# Patient Record
Sex: Female | Born: 1975 | Race: Black or African American | Hispanic: No | Marital: Married | Smoking: Never smoker
Health system: Southern US, Community
[De-identification: ages and names within clinical notes are randomized; demographics above are authoritative.]

## PROBLEM LIST (undated history)

## (undated) DIAGNOSIS — I10 Essential (primary) hypertension: Secondary | ICD-10-CM

## (undated) DIAGNOSIS — E119 Type 2 diabetes mellitus without complications: Secondary | ICD-10-CM

## (undated) HISTORY — PX: GASTRIC BYPASS: SHX52

---

## 1997-11-05 ENCOUNTER — Other Ambulatory Visit: Admission: RE | Admit: 1997-11-05 | Discharge: 1997-11-05 | Payer: Self-pay | Admitting: Obstetrics & Gynecology

## 2000-04-22 ENCOUNTER — Encounter: Admission: RE | Admit: 2000-04-22 | Discharge: 2000-04-22 | Payer: Self-pay | Admitting: Family Medicine

## 2000-04-22 ENCOUNTER — Encounter: Payer: Self-pay | Admitting: Family Medicine

## 2001-07-03 ENCOUNTER — Emergency Department (HOSPITAL_COMMUNITY): Admission: EM | Admit: 2001-07-03 | Discharge: 2001-07-03 | Payer: Self-pay | Admitting: Emergency Medicine

## 2001-07-09 ENCOUNTER — Encounter: Admission: RE | Admit: 2001-07-09 | Discharge: 2001-10-07 | Payer: Self-pay | Admitting: Family Medicine

## 2001-12-08 ENCOUNTER — Emergency Department (HOSPITAL_COMMUNITY): Admission: EM | Admit: 2001-12-08 | Discharge: 2001-12-08 | Payer: Self-pay | Admitting: Emergency Medicine

## 2002-07-02 ENCOUNTER — Ambulatory Visit (HOSPITAL_COMMUNITY): Admission: RE | Admit: 2002-07-02 | Discharge: 2002-07-02 | Payer: Self-pay | Admitting: Family Medicine

## 2002-07-02 ENCOUNTER — Encounter: Payer: Self-pay | Admitting: Family Medicine

## 2003-01-01 ENCOUNTER — Encounter: Admission: RE | Admit: 2003-01-01 | Discharge: 2003-01-01 | Payer: Self-pay | Admitting: Family Medicine

## 2004-08-03 ENCOUNTER — Emergency Department (HOSPITAL_COMMUNITY): Admission: EM | Admit: 2004-08-03 | Discharge: 2004-08-03 | Payer: Self-pay | Admitting: Emergency Medicine

## 2005-04-19 ENCOUNTER — Encounter: Admission: RE | Admit: 2005-04-19 | Discharge: 2005-04-19 | Payer: Self-pay | Admitting: Internal Medicine

## 2007-10-27 ENCOUNTER — Emergency Department (HOSPITAL_COMMUNITY): Admission: EM | Admit: 2007-10-27 | Discharge: 2007-10-28 | Payer: Self-pay | Admitting: Emergency Medicine

## 2008-10-12 ENCOUNTER — Emergency Department (HOSPITAL_COMMUNITY): Admission: EM | Admit: 2008-10-12 | Discharge: 2008-10-12 | Payer: Self-pay | Admitting: Emergency Medicine

## 2010-04-29 ENCOUNTER — Emergency Department (HOSPITAL_COMMUNITY)
Admission: EM | Admit: 2010-04-29 | Discharge: 2010-04-29 | Disposition: A | Discharge status: Home / Self Care | Payer: BC Managed Care – PPO | Attending: Emergency Medicine | Admitting: Emergency Medicine

## 2010-04-29 ENCOUNTER — Emergency Department (HOSPITAL_COMMUNITY): Payer: BC Managed Care – PPO

## 2010-04-29 DIAGNOSIS — E119 Type 2 diabetes mellitus without complications: Secondary | ICD-10-CM | POA: Insufficient documentation

## 2010-04-29 DIAGNOSIS — F411 Generalized anxiety disorder: Secondary | ICD-10-CM | POA: Insufficient documentation

## 2010-04-29 DIAGNOSIS — R6889 Other general symptoms and signs: Secondary | ICD-10-CM | POA: Insufficient documentation

## 2010-05-05 LAB — CBC
MCHC: 33.6 g/dL (ref 30.0–36.0)
MCV: 89.1 fL (ref 78.0–100.0)
RBC: 4.43 MIL/uL (ref 3.87–5.11)
RDW: 13.8 % (ref 11.5–15.5)

## 2010-05-05 LAB — URINALYSIS, ROUTINE W REFLEX MICROSCOPIC
Ketones, ur: NEGATIVE mg/dL
Nitrite: NEGATIVE
Protein, ur: NEGATIVE mg/dL
pH: 6.5 (ref 5.0–8.0)

## 2010-05-05 LAB — DIFFERENTIAL
Basophils Absolute: 0.1 10*3/uL (ref 0.0–0.1)
Basophils Relative: 1 % (ref 0–1)
Eosinophils Absolute: 0.1 10*3/uL (ref 0.0–0.7)
Monocytes Relative: 6 % (ref 3–12)
Neutro Abs: 5.6 10*3/uL (ref 1.7–7.7)
Neutrophils Relative %: 62 % (ref 43–77)

## 2010-05-05 LAB — POCT I-STAT, CHEM 8
BUN: 7 mg/dL (ref 6–23)
Calcium, Ion: 1.18 mmol/L (ref 1.12–1.32)
Chloride: 104 mEq/L (ref 96–112)
Glucose, Bld: 136 mg/dL — ABNORMAL HIGH (ref 70–99)
TCO2: 27 mmol/L (ref 0–100)

## 2010-05-05 LAB — HEPATIC FUNCTION PANEL
AST: 25 U/L (ref 0–37)
Albumin: 3.3 g/dL — ABNORMAL LOW (ref 3.5–5.2)
Bilirubin, Direct: 0.1 mg/dL (ref 0.0–0.3)
Total Bilirubin: 0.5 mg/dL (ref 0.3–1.2)

## 2010-05-05 LAB — GLUCOSE, CAPILLARY: Glucose-Capillary: 131 mg/dL — ABNORMAL HIGH (ref 70–99)

## 2010-05-05 LAB — LIPASE, BLOOD: Lipase: 16 U/L (ref 11–59)

## 2010-05-14 ENCOUNTER — Emergency Department (HOSPITAL_COMMUNITY)
Admission: EM | Admit: 2010-05-14 | Discharge: 2010-05-14 | Disposition: A | Discharge status: Home / Self Care | Payer: BC Managed Care – PPO | Attending: Emergency Medicine | Admitting: Emergency Medicine

## 2010-05-14 DIAGNOSIS — IMO0002 Reserved for concepts with insufficient information to code with codable children: Secondary | ICD-10-CM | POA: Insufficient documentation

## 2010-05-14 DIAGNOSIS — R000 Tachycardia, unspecified: Secondary | ICD-10-CM | POA: Insufficient documentation

## 2010-05-14 DIAGNOSIS — E119 Type 2 diabetes mellitus without complications: Secondary | ICD-10-CM | POA: Insufficient documentation

## 2010-05-14 DIAGNOSIS — I10 Essential (primary) hypertension: Secondary | ICD-10-CM | POA: Insufficient documentation

## 2010-05-14 DIAGNOSIS — T18108A Unspecified foreign body in esophagus causing other injury, initial encounter: Secondary | ICD-10-CM | POA: Insufficient documentation

## 2010-05-15 ENCOUNTER — Telehealth: Payer: Self-pay | Admitting: Internal Medicine

## 2010-05-15 NOTE — Telephone Encounter (Signed)
I spoke with the patient and she is scheduled for EGD with possible dil per Dr Leone Payor at [facility] tomorrow at 10:30.  She is given verbal instructions to hold her diabetes meds and be NPO after midnight.  Patient verbalized understanding.

## 2010-05-16 ENCOUNTER — Telehealth: Payer: Self-pay | Admitting: *Deleted

## 2010-05-16 ENCOUNTER — Ambulatory Visit (HOSPITAL_COMMUNITY)
Admission: RE | Admit: 2010-05-16 | Discharge: 2010-05-16 | Disposition: A | Discharge status: Home / Self Care | Payer: BC Managed Care – PPO | Source: Ambulatory Visit | Attending: Internal Medicine | Admitting: Internal Medicine

## 2010-05-16 ENCOUNTER — Encounter: Payer: BC Managed Care – PPO | Admitting: Internal Medicine

## 2010-05-16 ENCOUNTER — Telehealth: Payer: Self-pay | Admitting: Internal Medicine

## 2010-05-16 DIAGNOSIS — Z01812 Encounter for preprocedural laboratory examination: Secondary | ICD-10-CM | POA: Insufficient documentation

## 2010-05-16 DIAGNOSIS — Z9884 Bariatric surgery status: Secondary | ICD-10-CM | POA: Insufficient documentation

## 2010-05-16 DIAGNOSIS — R933 Abnormal findings on diagnostic imaging of other parts of digestive tract: Secondary | ICD-10-CM

## 2010-05-16 DIAGNOSIS — K222 Esophageal obstruction: Secondary | ICD-10-CM | POA: Insufficient documentation

## 2010-05-16 DIAGNOSIS — R1310 Dysphagia, unspecified: Secondary | ICD-10-CM | POA: Insufficient documentation

## 2010-05-16 DIAGNOSIS — R1319 Other dysphagia: Secondary | ICD-10-CM

## 2010-05-16 NOTE — Telephone Encounter (Signed)
Message copied by Jesse Fall on Tue May 16, 2010  3:48 PM ------      Message from: Stan Head      Created: Tue May 16, 2010 11:23 AM       Needs upper GI seris arranged - call tomorrow as she had EGD today      Thanx

## 2010-05-16 NOTE — Telephone Encounter (Signed)
Scheduled Upper GI series at [facility] radiology on 05/18/10 at 9:00 AM/arrival at 8:45 AM(Jennifer) . NPO after midnight.

## 2010-05-16 NOTE — Telephone Encounter (Signed)
Spoke with patient and she states she ate cheese several hours ago and it is stuck in her throat. Feels it moving up and down. Paged Dr. Leone Payor Per Dr. Leone Payor, as long as she is drinking liquids and handling her secretions, she should go back to fluids only tonight. Call us tomorrow if not any better. Patient states she has had 2 bottles of water but still feels the cheese moving up and down and she cannot get any relief.

## 2010-05-17 ENCOUNTER — Telehealth: Payer: Self-pay | Admitting: *Deleted

## 2010-05-17 NOTE — Telephone Encounter (Signed)
Jennifer at radiology made a note for patient to swallow a Barium tablet with upper GI series.

## 2010-05-17 NOTE — Telephone Encounter (Signed)
Patient called and left a message for me to call her at [phone]. Spoke with patient and gave her the appointment and instructions. She wanted to know if she could change the appointment time. Gave her [facility] radiology number to call.

## 2010-05-17 NOTE — Telephone Encounter (Signed)
Left message for patient to call me back re: her appointment at [facility] for  Upper GI series- 05/18/10 8:45 AM arrival for 9:00 AM, NPO after Midnight.

## 2010-05-17 NOTE — Telephone Encounter (Signed)
Call radiology and ask them to have her swallow a barium tablet with her Upper GI also.

## 2010-05-18 ENCOUNTER — Ambulatory Visit (HOSPITAL_COMMUNITY)
Admit: 2010-05-18 | Discharge: 2010-05-18 | Disposition: A | Discharge status: Home / Self Care | Payer: BC Managed Care – PPO | Attending: Internal Medicine | Admitting: Internal Medicine

## 2010-05-18 ENCOUNTER — Other Ambulatory Visit: Payer: Self-pay | Admitting: Internal Medicine

## 2010-05-18 ENCOUNTER — Telehealth: Payer: Self-pay

## 2010-05-18 DIAGNOSIS — Z9884 Bariatric surgery status: Secondary | ICD-10-CM

## 2010-05-18 DIAGNOSIS — R1310 Dysphagia, unspecified: Secondary | ICD-10-CM | POA: Insufficient documentation

## 2010-05-18 NOTE — Telephone Encounter (Signed)
Message copied by Darcey Nora on Thu May 18, 2010  4:01 PM ------      Message from: Stan Head      Created: Thu May 18, 2010  3:57 PM       The upper GI did not reveal a cause. It looks normal.      Ask her if she still has the sensation of the tablet in her esophagus or if it has gone away, when did that happen.

## 2010-05-18 NOTE — Telephone Encounter (Signed)
Lets try prevacid Solutab for 2 months. If it does not resolve after 4-6 weeks call back

## 2010-05-18 NOTE — Progress Notes (Signed)
Quick Note:  The upper GI did not reveal a cause. It looks normal. Ask her if she still has the sensation of the tablet in her esophagus or if it has gone away, when did that happen. ______

## 2010-05-18 NOTE — Telephone Encounter (Signed)
She says that the sensation of something being in her throat is always there is always .  She ?s If this could be allergies? She also had discomfort in her chest and back she took Tums and the pain went away.  She is requesting something that can be crushed or a liquid if you are going to prescribe meds.

## 2010-05-18 NOTE — Progress Notes (Signed)
See phone note for details.  Pt advised of results

## 2010-05-19 ENCOUNTER — Telehealth: Payer: Self-pay | Admitting: *Deleted

## 2010-05-19 MED ORDER — LANSOPRAZOLE 30 MG PO TBDP: 30.0000 mg | ORAL_TABLET | Freq: Every day | ORAL | Status: DC

## 2010-05-19 NOTE — Telephone Encounter (Signed)
Prior Authorization.

## 2010-05-19 NOTE — Telephone Encounter (Signed)
Patient aware of the above, rx sent she will call back for any further problems

## 2010-05-22 ENCOUNTER — Other Ambulatory Visit: Payer: Self-pay | Admitting: Internal Medicine

## 2010-05-22 ENCOUNTER — Telehealth: Payer: Self-pay | Admitting: *Deleted

## 2010-05-22 NOTE — Telephone Encounter (Addendum)
Called in RX to [facility] [place]... Spoke with Johnny Bridge and she put in a 90 day supply for Nexium 40 mg oral suspension.  Appeal number is [id number]  Will send papers down to be scanned in     Samples up front

## 2010-05-23 NOTE — Telephone Encounter (Signed)
I called patient at home and work.  Left a message. Just need to inform patient that she has Nexium samples up front and new RX was sent to her pharmacy for Nexium powder

## 2010-07-06 ENCOUNTER — Other Ambulatory Visit: Payer: Self-pay | Admitting: Internal Medicine

## 2010-07-06 MED ORDER — ESOMEPRAZOLE MAGNESIUM 40 MG PO PACK: PACK | ORAL | Status: DC

## 2010-07-06 NOTE — Telephone Encounter (Signed)
Refilled Nexium 40 mg packet #30 with 11 refills sent to [facility] [place], [place]

## 2010-07-07 ENCOUNTER — Telehealth: Payer: Self-pay | Admitting: Internal Medicine

## 2010-07-07 MED ORDER — ESOMEPRAZOLE MAGNESIUM 40 MG PO CPDR: DELAYED_RELEASE_CAPSULE | ORAL | Status: DC

## 2010-07-07 NOTE — Telephone Encounter (Signed)
[facility] packets are not in stock. Talked with the patient about taking Nexium 40 mg tablets (open tablet and put in applesauce) she stated that she could try that. Called [facility] [place],  and ordered Nexium 40 mg 1 tablet (open and put in applesauce) every day #30 with 2 refills.

## 2010-07-07 NOTE — Telephone Encounter (Signed)
Called patient and left message for her to call back.

## 2010-07-31 ENCOUNTER — Telehealth: Payer: Self-pay | Admitting: Internal Medicine

## 2010-07-31 MED ORDER — LANSOPRAZOLE 30 MG PO CPDR: DELAYED_RELEASE_CAPSULE | ORAL | Status: DC

## 2010-07-31 NOTE — Telephone Encounter (Signed)
Patient informed that medication was sent to her pharmacy and is aware of the directions.

## 2010-07-31 NOTE — Telephone Encounter (Signed)
Left a return call message and left a message for patient to call me back.

## 2010-07-31 NOTE — Telephone Encounter (Signed)
See lansoprazole rx - to open and place into applesauce

## 2010-10-30 LAB — CBC
HCT: 39.6
Hemoglobin: 13.4
MCV: 90.3
Platelets: ADEQUATE
RDW: 14.1

## 2010-10-30 LAB — BASIC METABOLIC PANEL
BUN: 8
Chloride: 103
GFR calc Af Amer: >60
Glucose, Bld: 133 — ABNORMAL HIGH
Potassium: 4.2

## 2010-10-30 LAB — DIFFERENTIAL
Basophils Absolute: 0
Eosinophils Absolute: 0.1
Eosinophils Relative: 1
Lymphs Abs: 2.9
Monocytes Absolute: 1

## 2010-10-30 LAB — RAPID URINE DRUG SCREEN, HOSP PERFORMED
Amphetamines: NOT DETECTED
Benzodiazepines: NOT DETECTED
Cocaine: NOT DETECTED
Tetrahydrocannabinol: NOT DETECTED

## 2010-10-30 LAB — D-DIMER, QUANTITATIVE: D-Dimer, Quant: 4.42 — ABNORMAL HIGH

## 2010-11-01 ENCOUNTER — Other Ambulatory Visit: Payer: Self-pay | Admitting: Internal Medicine

## 2014-03-09 ENCOUNTER — Other Ambulatory Visit: Payer: Self-pay

## 2014-03-09 ENCOUNTER — Emergency Department (HOSPITAL_COMMUNITY)
Admission: EM | Admit: 2014-03-09 | Discharge: 2014-03-09 | Disposition: A | Discharge status: Home / Self Care | Payer: Medicaid Other | Attending: Emergency Medicine | Admitting: Emergency Medicine

## 2014-03-09 ENCOUNTER — Encounter (HOSPITAL_COMMUNITY): Payer: Self-pay | Admitting: *Deleted

## 2014-03-09 ENCOUNTER — Emergency Department (HOSPITAL_COMMUNITY): Payer: Medicaid Other

## 2014-03-09 DIAGNOSIS — R079 Chest pain, unspecified: Secondary | ICD-10-CM | POA: Diagnosis present

## 2014-03-09 DIAGNOSIS — I10 Essential (primary) hypertension: Secondary | ICD-10-CM | POA: Insufficient documentation

## 2014-03-09 DIAGNOSIS — E119 Type 2 diabetes mellitus without complications: Secondary | ICD-10-CM | POA: Diagnosis not present

## 2014-03-09 DIAGNOSIS — R0789 Other chest pain: Secondary | ICD-10-CM | POA: Diagnosis not present

## 2014-03-09 HISTORY — DX: Type 2 diabetes mellitus without complications: E11.9

## 2014-03-09 HISTORY — DX: Essential (primary) hypertension: I10

## 2014-03-09 LAB — CBC
HEMATOCRIT: 38.1 % (ref 36.0–46.0)
Hemoglobin: 13 g/dL (ref 12.0–15.0)
MCH: 29.3 pg (ref 26.0–34.0)
MCHC: 34.1 g/dL (ref 30.0–36.0)
MCV: 86 fL (ref 78.0–100.0)
Platelets: 291 10*3/uL (ref 150–400)
RBC: 4.43 MIL/uL (ref 3.87–5.11)
RDW: 13.8 % (ref 11.5–15.5)
WBC: 9.9 10*3/uL (ref 4.0–10.5)

## 2014-03-09 LAB — BASIC METABOLIC PANEL
Anion gap: 11 (ref 5–15)
BUN: 9 mg/dL (ref 6–23)
CHLORIDE: 101 mmol/L (ref 96–112)
CO2: 25 mmol/L (ref 19–32)
CREATININE: 0.61 mg/dL (ref 0.50–1.10)
Calcium: 9.5 mg/dL (ref 8.4–10.5)
GFR calc Af Amer: >90 mL/min (ref >90)
GFR calc non Af Amer: >90 mL/min (ref >90)
Glucose, Bld: 168 mg/dL — ABNORMAL HIGH (ref 70–99)
Potassium: 3.7 mmol/L (ref 3.5–5.1)
Sodium: 137 mmol/L (ref 135–145)

## 2014-03-09 LAB — I-STAT TROPONIN, ED: Troponin i, poc: 0 ng/mL (ref 0.00–0.08)

## 2014-03-09 NOTE — ED Notes (Signed)
Consulted with lab about I-stat troponin results, Phlebotomist to room to collect

## 2014-03-09 NOTE — Discharge Instructions (Signed)
Please follow up with your primary care physician in 1-2 days. If you do not have one please call the [facility] number listed above. Please read all discharge instructions and return precautions.  ° ° °Chest Pain (Nonspecific) °It is often hard to give a specific diagnosis for the cause of chest pain. There is always a chance that your pain could be related to something serious, such as a heart attack or a blood clot in the lungs. You need to follow up with your health care provider for further evaluation. °CAUSES  °· Heartburn. °· Pneumonia or bronchitis. °· Anxiety or stress. °· Inflammation around your heart (pericarditis) or lung (pleuritis or pleurisy). °· A blood clot in the lung. °· A collapsed lung (pneumothorax). It can develop suddenly on its own (spontaneous pneumothorax) or from trauma to the chest. °· Shingles infection (herpes zoster virus). °The chest wall is composed of bones, muscles, and cartilage. Any of these can be the source of the pain. °· The bones can be bruised by injury. °· The muscles or cartilage can be strained by coughing or overwork. °· The cartilage can be affected by inflammation and become sore (costochondritis). °DIAGNOSIS  °Lab tests or other studies may be needed to find the cause of your pain. Your health care provider may have you take a test called an ambulatory electrocardiogram (ECG). An ECG records your heartbeat patterns over a 24-hour period. You may also have other tests, such as: °· Transthoracic echocardiogram (TTE). During echocardiography, sound waves are used to evaluate how blood flows through your heart. °· Transesophageal echocardiogram (TEE). °· Cardiac monitoring. This allows your health care provider to monitor your heart rate and rhythm in real time. °· Holter monitor. This is a portable device that records your heartbeat and can help diagnose heart arrhythmias. It allows your health care provider to track your heart activity for  several days, if needed. °· Stress tests by exercise or by giving medicine that makes the heart beat faster. °TREATMENT  °· Treatment depends on what may be causing your chest pain. Treatment may include: °¨ Acid blockers for heartburn. °¨ Anti-inflammatory medicine. °¨ Pain medicine for inflammatory conditions. °¨ Antibiotics if an infection is present. °· You may be advised to change lifestyle habits. This includes stopping smoking and avoiding alcohol, caffeine, and chocolate. °· You may be advised to keep your head raised (elevated) when sleeping. This reduces the chance of acid going backward from your stomach into your esophagus. °Most of the time, nonspecific chest pain will improve within 2-3 days with rest and mild pain medicine.  °HOME CARE INSTRUCTIONS  °· If antibiotics were prescribed, take them as directed. Finish them even if you start to feel better. °· For the next few days, avoid physical activities that bring on chest pain. Continue physical activities as directed. °· Do not use any tobacco products, including cigarettes, chewing tobacco, or electronic cigarettes. °· Avoid drinking alcohol. °· Only take medicine as directed by your health care provider. °· Follow your health care provider's suggestions for further testing if your chest pain does not go away. °· Keep any follow-up appointments you made. If you do not go to an appointment, you could develop lasting (chronic) problems with pain. If there is any problem keeping an appointment, call to reschedule. °SEEK MEDICAL CARE IF:  °· Your chest pain does not go away, even after treatment. °· You have a rash with blisters on your chest. °· You have a fever. °  SEEK IMMEDIATE MEDICAL CARE IF:  °· You have increased chest pain or pain that spreads to your arm, neck, jaw, back, or abdomen. °· You have shortness of breath. °· You have an increasing cough, or you cough up blood. °· You have severe back or abdominal pain. °· You feel nauseous or  vomit. °· You have severe weakness. °· You faint. °· You have chills. °This is an emergency. Do not wait to see if the pain will go away. Get medical help at once. Call your local emergency services (911 in [place] Do not drive yourself to the hospital. °MAKE SURE YOU:  °· Understand these instructions. °· Will watch your condition. °· Will get help right away if you are not doing well or get worse. °Document Released: 10/25/2004 Document Revised: 01/20/2013 Document Reviewed: 08/21/2007 °ExitCare® Patient Information ©2015 [employer]. This information is not intended to replace advice given to you by your health care provider. Make sure you discuss any questions you have with your health care provider. ° °

## 2014-03-09 NOTE — ED Notes (Signed)
Pt reports chest pain and left arm tingling for a couple of weeks. Pt states the chest pressure and left arm tingling felt different today. Pt denies shortness of breath, n/v, diaphoresis.

## 2014-03-09 NOTE — ED Provider Notes (Signed)
CSN: [id number]     Arrival date & time 03/09/14  1637 History   First MD Initiated Contact with Patient 03/09/14 2035     Chief Complaint  Patient presents with  . Chest Pain     (Consider location/radiation/quality/duration/timing/severity/associated sxs/prior Treatment) HPI Comments: Patient is a 39 yo F PMHx significant for HTN, DM presenting to the ED for left-sided chest pressure with radiation to her arm. She states she had an episode at 4 PM this afternoon while driving. She states she felt very stressed prior to the onset of chest pain. She states she has had intermittent episodes like this on and off for the last couple of weeks. Denies any further symptoms since this afternoon. Denies any associated shortness of breath, nausea, vomiting, diaphoresis, abdominal pain, extremity swelling.  Patient is a 39 y.o. female presenting with chest pain. The history is provided by the patient.  Chest Pain Pain location:  L chest Pain quality: pressure   Pain radiates to:  L arm Pain radiates to the back: no   Pain severity:  Mild Onset quality:  Sudden Duration:  30 minutes Timing:  Rare Progression:  Resolved Chronicity:  Recurrent Context: stress   Context: not breathing, not eating, not lifting, no movement and no trauma   Relieved by:  None tried Worsened by:  Nothing tried Ineffective treatments:  None tried Associated symptoms: anxiety   Associated symptoms: no abdominal pain, no anorexia, no claudication, no cough, no diaphoresis, no dizziness, no fever, no headache, no nausea, no near-syncope, no numbness, no orthopnea, no palpitations and no shortness of breath   Risk factors: diabetes mellitus and hypertension   Risk factors: no aortic disease, no birth control, no immobilization, not female, no Marfan's syndrome, not pregnant, no prior DVT/PE, no smoking and no surgery     Past Medical History  Diagnosis Date  . Hypertension   . Diabetes mellitus without complication     Past Surgical History  Procedure Laterality Date  . Gastric bypass     History reviewed. No pertinent family history. History  Substance Use Topics  . Smoking status: Never Smoker   . Smokeless tobacco: Never Used  . Alcohol Use: No   OB History    No data available     Review of Systems  Constitutional: Negative for fever and diaphoresis.  Respiratory: Negative for cough and shortness of breath.   Cardiovascular: Positive for chest pain. Negative for palpitations, orthopnea, claudication and near-syncope.  Gastrointestinal: Negative for nausea, abdominal pain and anorexia.  Neurological: Negative for dizziness, numbness and headaches.  All other systems reviewed and are negative.     Allergies  Cephalexin  Home Medications   Prior to Admission medications   Medication Sig Start Date End Date Taking? Authorizing Provider  lansoprazole (PREVACID) 30 MG capsule OPEN CAPSULE AND PLACE CONTENTS INTO APPLESAUCE AND SWALLOW 30 MINUTES BEFORE BREAKFAST 11/01/10   Iva Boop, MD   BP 157/81 mmHg  Pulse 90  Temp(Src) 98.9 F (37.2 C) (Oral)  Resp 11  Ht  (1.676 m)  Wt 360 lb (163.295 kg)  BMI 58.13 kg/m2  SpO2 100% Physical Exam  Constitutional: She is oriented to person, place, and time. She appears well-developed and well-nourished. No distress.  HENT:  Head: Normocephalic and atraumatic.  Right Ear: External ear normal.  Left Ear: External ear normal.  Nose: Nose normal.  Mouth/Throat: Oropharynx is clear and moist. No oropharyngeal exudate.  Eyes: Conjunctivae are normal.  Neck: Normal range  of motion. Neck supple.  Cardiovascular: Normal rate, regular rhythm and normal heart sounds.   Pulmonary/Chest: Effort normal and breath sounds normal. No respiratory distress.  Abdominal: Soft. There is no tenderness.  Musculoskeletal: Normal range of motion. She exhibits no edema.  Neurological: She is alert and oriented to person, place, and time.  Skin: Skin  is warm and dry. She is not diaphoretic.  Psychiatric: She has a normal mood and affect.  Nursing note and vitals reviewed.   ED Course  Procedures (including critical care time) Medications - No data to display  Labs Review Labs Reviewed  BASIC METABOLIC PANEL - Abnormal; Notable for the following:    Glucose, Bld 168 (*)    All other components within normal limits  CBC  I-STAT TROPOININ, ED    Imaging Review Dg Chest 2 View  03/09/2014   CLINICAL DATA:  Left-sided chest pain and pressure  EXAM: CHEST  2 VIEW  COMPARISON:  10/28/2007  FINDINGS: Normal heart size and mediastinal contours. Minimal atelectasis or scar at the left base. No consolidation or edema. No effusion or pneumothorax. No acute osseous findings.  IMPRESSION: Mild atelectasis or scarring at the left base.   Electronically Signed   By: Marnee SpringJonathon  Watts M.D.   On: 03/09/2014 18:10     EKG Interpretation None      MDM   Final diagnoses:  Chest pain, atypical    Filed Vitals:   03/09/14 2130  BP: 157/81  Pulse: 90  Temp:   Resp: 11   Afebrile, NAD, non-toxic appearing, AAOx4.  I have reviewed nursing notes, vital signs, and all appropriate lab and imaging results for this patient.   Patient is to be discharged with recommendation to follow up with PCP in regards to today's hospital visit. Chest pain is not likely of cardiac or pulmonary etiology d/t presentation, low suspicion for PE no hypoxia or SOB, VSS, no tracheal deviation, no JVD or new murmur, RRR, breath sounds equal bilaterally, EKG without acute abnormalities, negative troponin, and negative CXR. Pt has been advised to return to the ED is CP becomes exertional, associated with diaphoresis or nausea, radiates to left jaw/arm, worsens or becomes concerning in any way. Pt appears reliable for follow up and is agreeable to discharge. Patient is stable at time of discharge         Jeannetta EllisJennifer L Giles Currie, PA-C 03/10/14 [id number]  Candyce ChurnJohn David  Wofford III, MD 03/10/14 [phone]

## 2014-10-15 ENCOUNTER — Encounter (HOSPITAL_COMMUNITY): Payer: Self-pay | Admitting: Emergency Medicine

## 2014-10-15 ENCOUNTER — Emergency Department (HOSPITAL_COMMUNITY)
Admission: EM | Admit: 2014-10-15 | Discharge: 2014-10-15 | Disposition: A | Discharge status: Home / Self Care | Payer: Medicaid Other | Attending: Emergency Medicine | Admitting: Emergency Medicine

## 2014-10-15 ENCOUNTER — Telehealth: Payer: Self-pay | Admitting: *Deleted

## 2014-10-15 DIAGNOSIS — Z206 Contact with and (suspected) exposure to human immunodeficiency virus [HIV]: Secondary | ICD-10-CM

## 2014-10-15 DIAGNOSIS — Z79899 Other long term (current) drug therapy: Secondary | ICD-10-CM | POA: Insufficient documentation

## 2014-10-15 DIAGNOSIS — Z7951 Long term (current) use of inhaled steroids: Secondary | ICD-10-CM | POA: Insufficient documentation

## 2014-10-15 DIAGNOSIS — E119 Type 2 diabetes mellitus without complications: Secondary | ICD-10-CM | POA: Insufficient documentation

## 2014-10-15 DIAGNOSIS — I10 Essential (primary) hypertension: Secondary | ICD-10-CM | POA: Insufficient documentation

## 2014-10-15 DIAGNOSIS — Z202 Contact with and (suspected) exposure to infections with a predominantly sexual mode of transmission: Secondary | ICD-10-CM | POA: Diagnosis present

## 2014-10-15 DIAGNOSIS — N898 Other specified noninflammatory disorders of vagina: Secondary | ICD-10-CM | POA: Diagnosis not present

## 2014-10-15 LAB — URINALYSIS, ROUTINE W REFLEX MICROSCOPIC
BILIRUBIN URINE: NEGATIVE
Glucose, UA: NEGATIVE mg/dL
HGB URINE DIPSTICK: NEGATIVE
KETONES UR: NEGATIVE mg/dL
Leukocytes, UA: NEGATIVE
NITRITE: NEGATIVE
PROTEIN: NEGATIVE mg/dL
Specific Gravity, Urine: 1.019 (ref 1.005–1.030)
UROBILINOGEN UA: 1 mg/dL (ref 0.0–1.0)
pH: 5.5 (ref 5.0–8.0)

## 2014-10-15 LAB — RAPID HIV SCREEN (HIV 1/2 AB+AG)
HIV 1/2 ANTIBODIES: NONREACTIVE
HIV-1 P24 ANTIGEN - HIV24: NONREACTIVE

## 2014-10-15 LAB — WET PREP, GENITAL
TRICH WET PREP: NONE SEEN
Yeast Wet Prep HPF POC: NONE SEEN

## 2014-10-15 MED ORDER — EMTRICITABINE-TENOFOVIR DF 200-300 MG PO TABS: 1.0000 | ORAL_TABLET | Freq: Every day | ORAL | Status: DC

## 2014-10-15 MED ORDER — DOLUTEGRAVIR SODIUM 50 MG PO TABS: 50.0000 mg | ORAL_TABLET | Freq: Every day | ORAL | Status: DC

## 2014-10-15 MED ORDER — EMTRICITABINE-TENOFOVIR DF 200-300 MG PO TABS
1.0000 | ORAL_TABLET | Freq: Every day | ORAL | Status: DC
Start: 2014-10-15 — End: 2014-12-02

## 2014-10-15 NOTE — Telephone Encounter (Addendum)
Patient called, inquiring if we can prescribe post exposure prophylaxis.  She states that she had an unprotected encounter last night with her husband.  She reports that he takes his medication daily, but last labs were 10/2013.  Patient is insured.  Per Dr. Orvan Falconer, the patient will need testing and treatment. As [facility]'s labs is closed, patient is advised to contact her OB/GYN or to go to the Emergency department at [facility] where they do have a PEP protocol. Patient verbalized understanding, agreement. Andree Coss, RN

## 2014-10-15 NOTE — Discharge Planning (Signed)
NCM consulted regarding pt exposure.  NCM contacted ID to confirm that if pt is within 48 hour time frame, she is to fill Rx as outlined on protocol.  Information relayed to EDP.

## 2014-10-15 NOTE — ED Notes (Signed)
Pt from home, states sexual partner was dx with HIV and was told to come get checked for possible exposure. Pt has no complaints, denies any pain. Alert and oriented. Has IUD inplace.

## 2014-10-15 NOTE — Discharge Instructions (Signed)
HIV Antibody Test °HIV is a virus which destroys our body's ability to fight illness. It does this by causing defects in our immune system. This is the system that protects our body against infections. This virus is the cause of an illness called AIDS. HIV antibodies are made by the infected person's body when a person becomes infected with HIV. °WHAT IS THE HIV ANTIBODY TEST? °This is a test for HIV antibodies that are found in the blood of an infected person. This test is not a test for AIDS. It only means that you have been infected with HIV and may eventually develop AIDS. °WHO IS AT RISK OF BEING INFECTED WITH HIV? °· People who have unsafe sex (unsafe sex means having sex without a condom (or other protective barrier) with a person who has the virus. °· People who share IV needles or syringes with a person who has the virus. °· Anyone who got blood, blood products, or organ transplants before 1985. °· Babies born to mothers who have HIV. °· Coming in contact with blood or other body fluids of someone infected with HIV. °WHAT DOES A NEGATIVE HIV TEST RESULT MEAN? °HIV antibodies were not found in your blood. Most of the time it takes our bodies between 6 weeks and 6 months to develop antibodies to HIV. It may take up to one year to develop. During this time, infected people can have a negative result even if they have the virus and will therefore not know if they are putting other people at risk. They should take all necessary precautions to protect others from becoming infected. °WHAT DOES A POSITIVE HIV TEST RESULT MEAN? °· A positive HIV test means a person has been infected with the HIV virus. This does NOT mean that a person has AIDS, but they may eventually develop it. °· A person can give HIV infection to other people through unsafe sex. Sharing IV needles or syringes can also spread HIV. °· A woman who has HIV can give the virus to her baby during pregnancy or at birth or possibly from breastfeeding.  Get counseling prior to considering pregnancy. One third to one half of women with HIV infection will pass this infection on to their baby. °· Anyone with a positive test for HIV should not donate blood, plasma, blood products, organs or tissues. °WHERE CAN I GO TO BE TESTED? °· Most county health departments offer HIV Antibody counseling and testing. °· Many doctors and other caregivers offer HIV Antibody counseling and testing. °If you received a blood test from your caregiver, call for your results as instructed. Remember it is your responsibility to get the results of your test. Do not assume everything is fine if you do not hear from your caregiver. If you get a positive test result, talk to your caregiver to find out what steps to take to assure you receive the best of care. Numerous medications are now available which improve the course of this infection. °Document Released: 01/13/2000 Document Revised: 04/09/2011 Document Reviewed: 04/17/2013 °[employer]® Patient Information ©2015 [employer]. This information is not intended to replace advice given to you by your health care provider. Make sure you discuss any questions you have with your health care provider. ° °

## 2014-10-15 NOTE — ED Notes (Signed)
Pt c/o possible exposure to STD found out last night.

## 2014-10-15 NOTE — ED Provider Notes (Signed)
CSN: [id number]     Arrival date & time 10/15/14  1043 History   First MD Initiated Contact with Patient 10/15/14 1233     Chief Complaint  Patient presents with  . Exposure to STD   HPI  [name] is a 39 year old female presenting after possible exposure to HIV last evening. Pt's husband is HIV positive and she reports they typically use condoms for protection but did not last night. Her husband is followed by [facility] and reports compliance with his antiretrovirals. Pt is unsure of his last viral load or CD4 count. Pt called the infectious disease clinic who instructed her to come to ED for HIV antibody test and PEP. Pt requesting full STD panel "just to be sure" No other complaints at this time. Denies fevers, chest pain, SOB, abdominal pain, nausea, vomiting, vaginal discharge and dysuria.   Past Medical History  Diagnosis Date  . Hypertension   . Diabetes mellitus without complication    Past Surgical History  Procedure Laterality Date  . Gastric bypass     No family history on file. Social History  Substance Use Topics  . Smoking status: Never Smoker   . Smokeless tobacco: Never Used  . Alcohol Use: No   OB History    No data available     Review of Systems  Respiratory: Negative for shortness of breath.   Cardiovascular: Negative for chest pain.  Gastrointestinal: Negative for nausea, vomiting and abdominal pain.  Genitourinary: Negative for dysuria and vaginal discharge.  Neurological: Negative for headaches.      Allergies  Cephalexin  Home Medications   Prior to Admission medications   Medication Sig Start Date End Date Taking? Authorizing Jin Shockley  canagliflozin (INVOKANA) 100 MG TABS tablet Take 100 mg by mouth daily.   Yes Historical Nereida Schepp, MD  diphenhydrAMINE (SOMINEX) 25 MG tablet Take 25 mg by mouth daily as needed for itching or sleep.   Yes Historical Rollyn Scialdone, MD  lansoprazole (PREVACID) 30 MG capsule  OPEN CAPSULE AND PLACE CONTENTS INTO APPLESAUCE AND SWALLOW 30 MINUTES BEFORE BREAKFAST 11/01/10  Yes Iva Boop, MD  lisinopril-hydrochlorothiazide (PRINZIDE,ZESTORETIC) 20-12.5 MG per tablet Take 2 tablets by mouth daily.   Yes Historical Jun Rightmyer, MD  metoprolol succinate (TOPROL-XL) 50 MG 24 hr tablet Take 50 mg by mouth daily. Take with or immediately following a meal.   Yes Historical Mulan Adan, MD  mometasone (NASONEX) 50 MCG/ACT nasal spray Place 2 sprays into the nose daily.   Yes Historical Shaquel Josephson, MD  montelukast (SINGULAIR) 10 MG tablet Take 10 mg by mouth at bedtime.   Yes Historical Khasir Woodrome, MD  Multiple Vitamin (MULTIVITAMIN) tablet Take 1 tablet by mouth daily.   Yes Historical Kailash Hinze, MD  sitaGLIPtin-metformin (JANUMET) 50-1000 MG per tablet Take 1 tablet by mouth 2 (two) times daily with a meal.   Yes Historical Tyquez Hollibaugh, MD  valACYclovir (VALTREX) 500 MG tablet Take 500 mg by mouth daily as needed (herpes).   Yes Historical Summar Mcglothlin, MD  dolutegravir (TIVICAY) 50 MG tablet Take 1 tablet (50 mg total) by mouth daily. 10/15/14   Stevi Barrett, PA-C  emtricitabine-tenofovir (TRUVADA) 200-300 MG per tablet Take 1 tablet by mouth daily. 10/15/14   Stevi Barrett, PA-C   BP 115/53 mmHg  Pulse 83  Temp(Src) 99.1 F (37.3 C) (Oral)  Resp 18  Ht 5' 6.5" (1.689 m)  Wt 350 lb (158.759 kg)  BMI 55.65 kg/m2  SpO2 100% Physical Exam  Constitutional: She appears  well-developed and well-nourished. No distress.  HENT:  Head: Normocephalic and atraumatic.  Eyes: Conjunctivae are normal. Right eye exhibits no discharge. Left eye exhibits no discharge. No scleral icterus.  Neck: Normal range of motion.  Cardiovascular: Normal rate.   Pulmonary/Chest: Effort normal. No respiratory distress.  Genitourinary: Uterus normal. There is no rash or lesion on the right labia. There is no rash or lesion on the left labia. Cervix exhibits no motion tenderness, no discharge and no friability. Right  adnexum displays no tenderness. Left adnexum displays no tenderness. No erythema or tenderness in the vagina. Vaginal discharge (scant, thin, white) found.  Musculoskeletal: Normal range of motion.  Neurological: She is alert. Coordination normal.  Skin: Skin is warm and dry.  Psychiatric: She has a normal mood and affect. Her behavior is normal.  Nursing note and vitals reviewed.   ED Course  Procedures (including critical care time) Labs Review Labs Reviewed  WET PREP, GENITAL - Abnormal; Notable for the following:    Clue Cells Wet Prep HPF POC FEW (*)    WBC, Wet Prep HPF POC FEW (*)    All other components within normal limits  URINALYSIS, ROUTINE W REFLEX MICROSCOPIC (NOT AT [facility])  RAPID HIV SCREEN (HIV 1/2 AB+AG)  RPR  HIV ANTIBODY (ROUTINE TESTING)  GC/CHLAMYDIA PROBE AMP ([place]) NOT AT [facility]    Imaging Review No results found. I have personally reviewed and evaluated these images and lab results as part of my medical decision-making.   EKG Interpretation None      MDM   Final diagnoses:  HIV exposure   Pt presenting after possible HV exposure and requesting STD testing. Denies current symptoms. VSS. On pelvic, vagina with scant white vaginal discharge. Wet prep shows few WBC and clue cells. Pt is asymptomatic and declines treatment for BV. Consult to pharmacy for HIV PEP. Will give 28 day course of Truvada and Tivicay per hospital non-occupational exposure protocol. Discussed potential side effects with pt and encouraged her to follow up at her husband's infectious disease clinic. Pt verbalizes understanding and agrees with this plan. Stable for discharge.     Alveta Heimlich, PA-C 10/15/14 1545  Laurence Spates, MD 10/15/14 [phone]

## 2014-10-16 LAB — RPR: RPR Ser Ql: NONREACTIVE

## 2014-10-16 LAB — HIV ANTIBODY (ROUTINE TESTING W REFLEX): HIV Screen 4th Generation wRfx: NONREACTIVE

## 2014-10-18 LAB — GC/CHLAMYDIA PROBE AMP (~~LOC~~) NOT AT ARMC
CHLAMYDIA, DNA PROBE: NEGATIVE
Neisseria Gonorrhea: NEGATIVE

## 2014-12-02 ENCOUNTER — Encounter: Payer: Self-pay | Admitting: Internal Medicine

## 2014-12-02 ENCOUNTER — Ambulatory Visit (INDEPENDENT_AMBULATORY_CARE_PROVIDER_SITE_OTHER): Payer: Medicaid Other | Admitting: Internal Medicine

## 2014-12-02 VITALS — BP 132/86 | HR 88 | Temp 98.8°F | Ht 66.5 in | Wt 348.0 lb

## 2014-12-02 DIAGNOSIS — Z79899 Other long term (current) drug therapy: Secondary | ICD-10-CM

## 2014-12-02 DIAGNOSIS — Z889 Allergy status to unspecified drugs, medicaments and biological substances status: Secondary | ICD-10-CM

## 2014-12-02 DIAGNOSIS — Z8619 Personal history of other infectious and parasitic diseases: Secondary | ICD-10-CM | POA: Diagnosis not present

## 2014-12-02 LAB — COMPLETE METABOLIC PANEL WITH GFR
ALBUMIN: 3.8 g/dL (ref 3.6–5.1)
ALK PHOS: 87 U/L (ref 33–115)
ALT: 16 U/L (ref 6–29)
AST: 18 U/L (ref 10–30)
BUN: 13 mg/dL (ref 7–25)
CALCIUM: 9.3 mg/dL (ref 8.6–10.2)
CO2: 24 mmol/L (ref 20–31)
CREATININE: 0.69 mg/dL (ref 0.50–1.10)
Chloride: 104 mmol/L (ref 98–110)
GFR, Est African American: >89 mL/min (ref >=60)
GFR, Est Non African American: >89 mL/min (ref >=60)
GLUCOSE: 124 mg/dL — AB (ref 65–99)
Potassium: 4 mmol/L (ref 3.5–5.3)
SODIUM: 137 mmol/L (ref 135–146)
Total Bilirubin: 0.5 mg/dL (ref 0.2–1.2)
Total Protein: 7 g/dL (ref 6.1–8.1)

## 2014-12-02 LAB — CBC WITH DIFFERENTIAL/PLATELET
BASOS PCT: 0 % (ref 0–1)
Basophils Absolute: 0 10*3/uL (ref 0.0–0.1)
Eosinophils Absolute: 0.1 10*3/uL (ref 0.0–0.7)
Eosinophils Relative: 1 % (ref 0–5)
HEMATOCRIT: 39.6 % (ref 36.0–46.0)
HEMOGLOBIN: 12.8 g/dL (ref 12.0–15.0)
LYMPHS PCT: 27 % (ref 12–46)
Lymphs Abs: 2.2 10*3/uL (ref 0.7–4.0)
MCH: 28.3 pg (ref 26.0–34.0)
MCHC: 32.3 g/dL (ref 30.0–36.0)
MCV: 87.4 fL (ref 78.0–100.0)
MONO ABS: 0.7 10*3/uL (ref 0.1–1.0)
MONOS PCT: 8 % (ref 3–12)
MPV: 10.3 fL (ref 8.6–12.4)
NEUTROS ABS: 5.2 10*3/uL (ref 1.7–7.7)
Neutrophils Relative %: 64 % (ref 43–77)
Platelets: 331 10*3/uL (ref 150–400)
RBC: 4.53 MIL/uL (ref 3.87–5.11)
RDW: 15 % (ref 11.5–15.5)
WBC: 8.2 10*3/uL (ref 4.0–10.5)

## 2014-12-02 MED ORDER — EMTRICITABINE-TENOFOVIR DF 200-300 MG PO TABS: 1.0000 | ORAL_TABLET | Freq: Every day | ORAL | Status: DC

## 2014-12-02 NOTE — Progress Notes (Signed)
Patient ID: Christine Carlson, female   DOB: [birth year], 39 y.o.   MRN: [id number]       Patient ID: Christine Carlson, female   DOB: 05-22-1975, 39 y.o.   MRN: [id number]  HPI  39yo F who is married to HIV+ partner. She went to the ED for post exposure prophylaxis in September given  A course of tivicay/truvada which she has completed. Her partner is on treatment but still having concern for sexual transmission. She is here for PrEP.   She tolerated tivicay/truvada without difficulty when she was taking it in September for PEP  Allergies  Allergen Reactions  . Cephalexin Rash    Rash on tongue and back of mouth    Outpatient Encounter Prescriptions as of 12/02/2014  Medication Sig  . canagliflozin (INVOKANA) 100 MG TABS tablet Take 100 mg by mouth daily.  . diphenhydrAMINE (SOMINEX) 25 MG tablet Take 25 mg by mouth daily as needed for itching or sleep.  Marland Kitchen dolutegravir (TIVICAY) 50 MG tablet Take 1 tablet (50 mg total) by mouth daily.  Marland Kitchen emtricitabine-tenofovir (TRUVADA) 200-300 MG per tablet Take 1 tablet by mouth daily.  . lansoprazole (PREVACID) 30 MG capsule OPEN CAPSULE AND PLACE CONTENTS INTO APPLESAUCE AND SWALLOW 30 MINUTES BEFORE BREAKFAST  . lisinopril-hydrochlorothiazide (PRINZIDE,ZESTORETIC) 20-12.5 MG per tablet Take 2 tablets by mouth daily.  . metoprolol succinate (TOPROL-XL) 50 MG 24 hr tablet Take 50 mg by mouth daily. Take with or immediately following a meal.  . mometasone (NASONEX) 50 MCG/ACT nasal spray Place 2 sprays into the nose daily.  . montelukast (SINGULAIR) 10 MG tablet Take 10 mg by mouth at bedtime.  . Multiple Vitamin (MULTIVITAMIN) tablet Take 1 tablet by mouth daily.  . sitaGLIPtin-metformin (JANUMET) 50-1000 MG per tablet Take 1 tablet by mouth 2 (two) times daily with a meal.  . valACYclovir (VALTREX) 500 MG tablet Take 500 mg by mouth daily as needed (herpes).   No facility-administered encounter medications on file as of 12/02/2014.      Active Ambulatory Problems    Diagnosis Date Noted  . H/O seasonal allergies 12/04/2014  . H/O herpes simplex infection 12/04/2014   Resolved Ambulatory Problems    Diagnosis Date Noted  . No Resolved Ambulatory Problems   Past Medical History  Diagnosis Date  . Hypertension   . Diabetes mellitus without complication [facility])       Health Maintenance Due  Topic Date Due  . TETANUS/TDAP  02/13/1994  . PAP SMEAR  02/14/1996  . INFLUENZA VACCINE  08/30/2014    Social History  Substance Use Topics  . Smoking status: Never Smoker   . Smokeless tobacco: Never Used  . Alcohol Use: No   Family hx: Heart disease, DM  Review of Systems Review of Systems  Constitutional: Negative for fever, chills, diaphoresis, activity change, appetite change, fatigue and unexpected weight change.  HENT: Negative for congestion, sore throat, rhinorrhea, sneezing, trouble swallowing and sinus pressure.  Eyes: Negative for photophobia and visual disturbance.  Respiratory: Negative for cough, chest tightness, shortness of breath, wheezing and stridor.  Cardiovascular: Negative for chest pain, palpitations and leg swelling.  Gastrointestinal: Negative for nausea, vomiting, abdominal pain, diarrhea, constipation, blood in stool, abdominal distention and anal bleeding.  Genitourinary: Negative for dysuria, hematuria, flank pain and difficulty urinating.  Musculoskeletal: Negative for myalgias, back pain, joint swelling, arthralgias and gait problem.  Skin: Negative for color change, pallor, rash and wound.  Neurological: Negative for dizziness, tremors, weakness and light-headedness.  Hematological:  Negative for adenopathy. Does not bruise/bleed easily.  Psychiatric/Behavioral: Negative for behavioral problems, confusion, sleep disturbance, dysphoric mood, decreased concentration and agitation.    Physical Exam   BP 132/86 mmHg  Pulse 88  Temp(Src) 98.8 F (37.1 C) (Oral)  Ht 5' 6.5" (1.689 m)   Wt 348 lb (157.852 kg)  BMI 55.33 kg/m2 Physical Exam  Constitutional:  oriented to person, place, and time. appears well-developed and well-nourished. No distress.  HENT: /AT, PERRLA, no scleral icterus Mouth/Throat: Oropharynx is clear and moist. No oropharyngeal exudate.  Cardiovascular: Normal rate, regular rhythm and normal heart sounds. Exam reveals no gallop and no friction rub.  No murmur heard.  Pulmonary/Chest: Effort normal and breath sounds normal. No respiratory distress.  has no wheezes.  Neck = supple, no nuchal rigidity Abdominal: Soft. Bowel sounds are normal.  exhibits no distension. There is no tenderness.  Lymphadenopathy: no cervical adenopathy. No axillary adenopathy Neurological: alert and oriented to person, place, and time.  Skin: Skin is warm and dry. No rash noted. No erythema.  Psychiatric: a normal mood and affect.  behavior is normal.   CBC Lab Results  Component Value Date   WBC 9.9 03/09/2014   RBC 4.43 03/09/2014   HGB 13.0 03/09/2014   HCT 38.1 03/09/2014   PLT 291 03/09/2014   MCV 86.0 03/09/2014   MCH 29.3 03/09/2014   MCHC 34.1 03/09/2014   RDW 13.8 03/09/2014   LYMPHSABS 2.6 10/12/2008   MONOABS 0.6 10/12/2008   EOSABS 0.1 10/12/2008   BASOSABS 0.1 10/12/2008   BMET Lab Results  Component Value Date   NA 137 03/09/2014   K 3.7 03/09/2014   CL 101 03/09/2014   CO2 25 03/09/2014   GLUCOSE 168* 03/09/2014   BUN 9 03/09/2014   CREATININE 0.61 03/09/2014   CALCIUM 9.5 03/09/2014   GFRNONAA >90 03/09/2014   GFRAA >90 03/09/2014     Assessment and Plan  PrEP = will get baseline labs. Start truvada. rtc in 1 month for check up. Can crush truvada but take it immediately without other meds  hsv proph = continue with valtrex   Spent 45 min, with greater than 50% in counseling for prevention of HIV disease

## 2014-12-03 LAB — HEPATITIS B SURFACE ANTIBODY,QUALITATIVE: HEP B S AB: POSITIVE — AB

## 2014-12-03 LAB — HEPATITIS A ANTIBODY, TOTAL: Hep A Total Ab: NONREACTIVE

## 2014-12-03 LAB — HEPATITIS B SURFACE ANTIGEN: Hepatitis B Surface Ag: NEGATIVE

## 2014-12-04 DIAGNOSIS — Z8619 Personal history of other infectious and parasitic diseases: Secondary | ICD-10-CM | POA: Insufficient documentation

## 2014-12-04 DIAGNOSIS — Z889 Allergy status to unspecified drugs, medicaments and biological substances status: Secondary | ICD-10-CM | POA: Insufficient documentation

## 2014-12-05 LAB — HIV-1 RNA QUANT-NO REFLEX-BLD: HIV-1 RNA Quant, Log: <1.3 Log copies/mL (ref <1.30)

## 2015-01-10 ENCOUNTER — Encounter: Payer: Self-pay | Admitting: Internal Medicine

## 2015-01-10 ENCOUNTER — Ambulatory Visit (INDEPENDENT_AMBULATORY_CARE_PROVIDER_SITE_OTHER): Payer: Medicaid Other | Admitting: Internal Medicine

## 2015-01-10 VITALS — BP 123/74 | HR 86 | Temp 97.6°F | Wt 350.0 lb

## 2015-01-10 DIAGNOSIS — Z7251 High risk heterosexual behavior: Secondary | ICD-10-CM | POA: Diagnosis present

## 2015-01-10 LAB — COMPLETE METABOLIC PANEL WITH GFR
ALT: 19 U/L (ref 6–29)
AST: 23 U/L (ref 10–30)
Albumin: 3.8 g/dL (ref 3.6–5.1)
Alkaline Phosphatase: 96 U/L (ref 33–115)
BUN: 11 mg/dL (ref 7–25)
CO2: 24 mmol/L (ref 20–31)
Calcium: 9.4 mg/dL (ref 8.6–10.2)
Chloride: 103 mmol/L (ref 98–110)
Creat: 0.6 mg/dL (ref 0.50–1.10)
GFR, Est African American: >89 mL/min (ref >=60)
GFR, Est Non African American: >89 mL/min (ref >=60)
GLUCOSE: 94 mg/dL (ref 65–99)
Potassium: 4.3 mmol/L (ref 3.5–5.3)
SODIUM: 138 mmol/L (ref 135–146)
TOTAL PROTEIN: 7.3 g/dL (ref 6.1–8.1)
Total Bilirubin: 0.4 mg/dL (ref 0.2–1.2)

## 2015-01-10 NOTE — Progress Notes (Signed)
Patient ID: Christine Carlson, female   DOB: September 18, 1975, 39 y.o.   MRN: [id number]      RFV: follow up on PrEP  Patient ID: Christine Carlson, female   DOB: 09/29/1975, 39 y.o.   MRN: [id number]  HPI Christine Carlson is a 39yo F who is hiv negative, has h/o herpes, is in long-term relationship with husband who is HIV+. She has been concerned about risk of contracting hiv from her significant other and preferred to be started on truvada. She is doing well on medication without difficulties of side effect. She is here follow up on medication tolerance.  Outpatient Encounter Prescriptions as of 01/10/2015  Medication Sig  . canagliflozin (INVOKANA) 100 MG TABS tablet Take 100 mg by mouth daily.  . clonazePAM (KLONOPIN) 0.5 MG tablet Take 0.5 mg by mouth 2 (two) times daily as needed for anxiety.  . diphenhydrAMINE (SOMINEX) 25 MG tablet Take 25 mg by mouth daily as needed for itching or sleep.  Marland Kitchen emtricitabine-tenofovir (TRUVADA) 200-300 MG tablet Take 1 tablet by mouth daily.  Marland Kitchen lisinopril-hydrochlorothiazide (PRINZIDE,ZESTORETIC) 20-12.5 MG per tablet Take 2 tablets by mouth daily.  . metoprolol succinate (TOPROL-XL) 50 MG 24 hr tablet Take 50 mg by mouth daily. Take with or immediately following a meal.  . mometasone (NASONEX) 50 MCG/ACT nasal spray Place 2 sprays into the nose daily.  . montelukast (SINGULAIR) 10 MG tablet Take 10 mg by mouth at bedtime.  . Multiple Vitamin (MULTIVITAMIN) tablet Take 1 tablet by mouth daily.  . sitaGLIPtin-metformin (JANUMET) 50-1000 MG per tablet Take 1 tablet by mouth 2 (two) times daily with a meal.  . valACYclovir (VALTREX) 500 MG tablet Take 500 mg by mouth daily.    No facility-administered encounter medications on file as of 01/10/2015.     Patient Active Problem List   Diagnosis Date Noted  . H/O seasonal allergies 12/04/2014  . H/O herpes simplex infection 12/04/2014     Health Maintenance Due  Topic Date Due  . TETANUS/TDAP   02/13/1994  . PAP SMEAR  02/14/1996     Review of Systems  Constitutional: Negative for fever, chills, diaphoresis, activity change, appetite change, fatigue and unexpected weight change.  HENT: Negative for congestion, sore throat, rhinorrhea, sneezing, trouble swallowing and sinus pressure.  Eyes: Negative for photophobia and visual disturbance.  Respiratory: Negative for cough, chest tightness, shortness of breath, wheezing and stridor.  Cardiovascular: Negative for chest pain, palpitations and leg swelling.  Gastrointestinal: Negative for nausea, vomiting, abdominal pain, diarrhea, constipation, blood in stool, abdominal distention and anal bleeding.  Genitourinary: Negative for dysuria, hematuria, flank pain and difficulty urinating.  Musculoskeletal: Negative for myalgias, back pain, joint swelling, arthralgias and gait problem.  Skin: Negative for color change, pallor, rash and wound.  Neurological: Negative for dizziness, tremors, weakness and light-headedness.  Hematological: Negative for adenopathy. Does not bruise/bleed easily.  Psychiatric/Behavioral: Negative for behavioral problems, confusion, sleep disturbance, dysphoric mood, decreased concentration and agitation.    Physical Exam   BP 123/74 mmHg  Pulse 86  Temp(Src) 97.6 F (36.4 C) (Oral)  Wt 350 lb (158.759 kg) Physical Exam  Constitutional:  oriented to person, place, and time. appears well-developed and well-nourished. No distress.  HENT: [place]/AT, PERRLA, no scleral icterus Mouth/Throat: Oropharynx is clear and moist. No oropharyngeal exudate.  Cardiovascular: Normal rate, regular rhythm and normal heart sounds. Exam reveals no gallop and no friction rub.  No murmur heard.  Pulmonary/Chest: Effort normal and breath sounds normal. No respiratory distress.  has  no wheezes.  Neck = supple, no nuchal rigidity Abdominal: Soft. Bowel sounds are normal.  exhibits no distension. There is no tenderness.    Lymphadenopathy: no cervical adenopathy. No axillary adenopathy Neurological: alert and oriented to person, place, and time.  Skin: Skin is warm and dry. No rash noted. No erythema.  Psychiatric: a normal mood and affect.  behavior is normal.    Lab Results  Component Value Date   HIV1RNAQUANT <20 12/02/2014   Lab Results  Component Value Date   HEPBSAB POS* 12/02/2014   No results found for: RPR  CBC Lab Results  Component Value Date   WBC 8.2 12/02/2014   RBC 4.53 12/02/2014   HGB 12.8 12/02/2014   HCT 39.6 12/02/2014   PLT 331 12/02/2014   MCV 87.4 12/02/2014   MCH 28.3 12/02/2014   MCHC 32.3 12/02/2014   RDW 15.0 12/02/2014   LYMPHSABS 2.2 12/02/2014   MONOABS 0.7 12/02/2014   EOSABS 0.1 12/02/2014   BASOSABS 0.0 12/02/2014   BMET Lab Results  Component Value Date   NA 137 12/02/2014   K 4.0 12/02/2014   CL 104 12/02/2014   CO2 24 12/02/2014   GLUCOSE 124* 12/02/2014   BUN 13 12/02/2014   CREATININE 0.69 12/02/2014   CALCIUM 9.3 12/02/2014   GFRNONAA >89 12/02/2014   GFRAA >89 12/02/2014     Assessment and Plan  hiv prep = continue with truvada, tolerating without difficulty  Get cmp today

## 2015-03-09 IMAGING — DX DG CHEST 2V
2 series · 2 of 2 positions shown · non-contrast
Comparison: 10/28/2007

CLINICAL DATA: Left-sided chest pain and pressure

EXAM:
CHEST  2 VIEW

[chest pa]
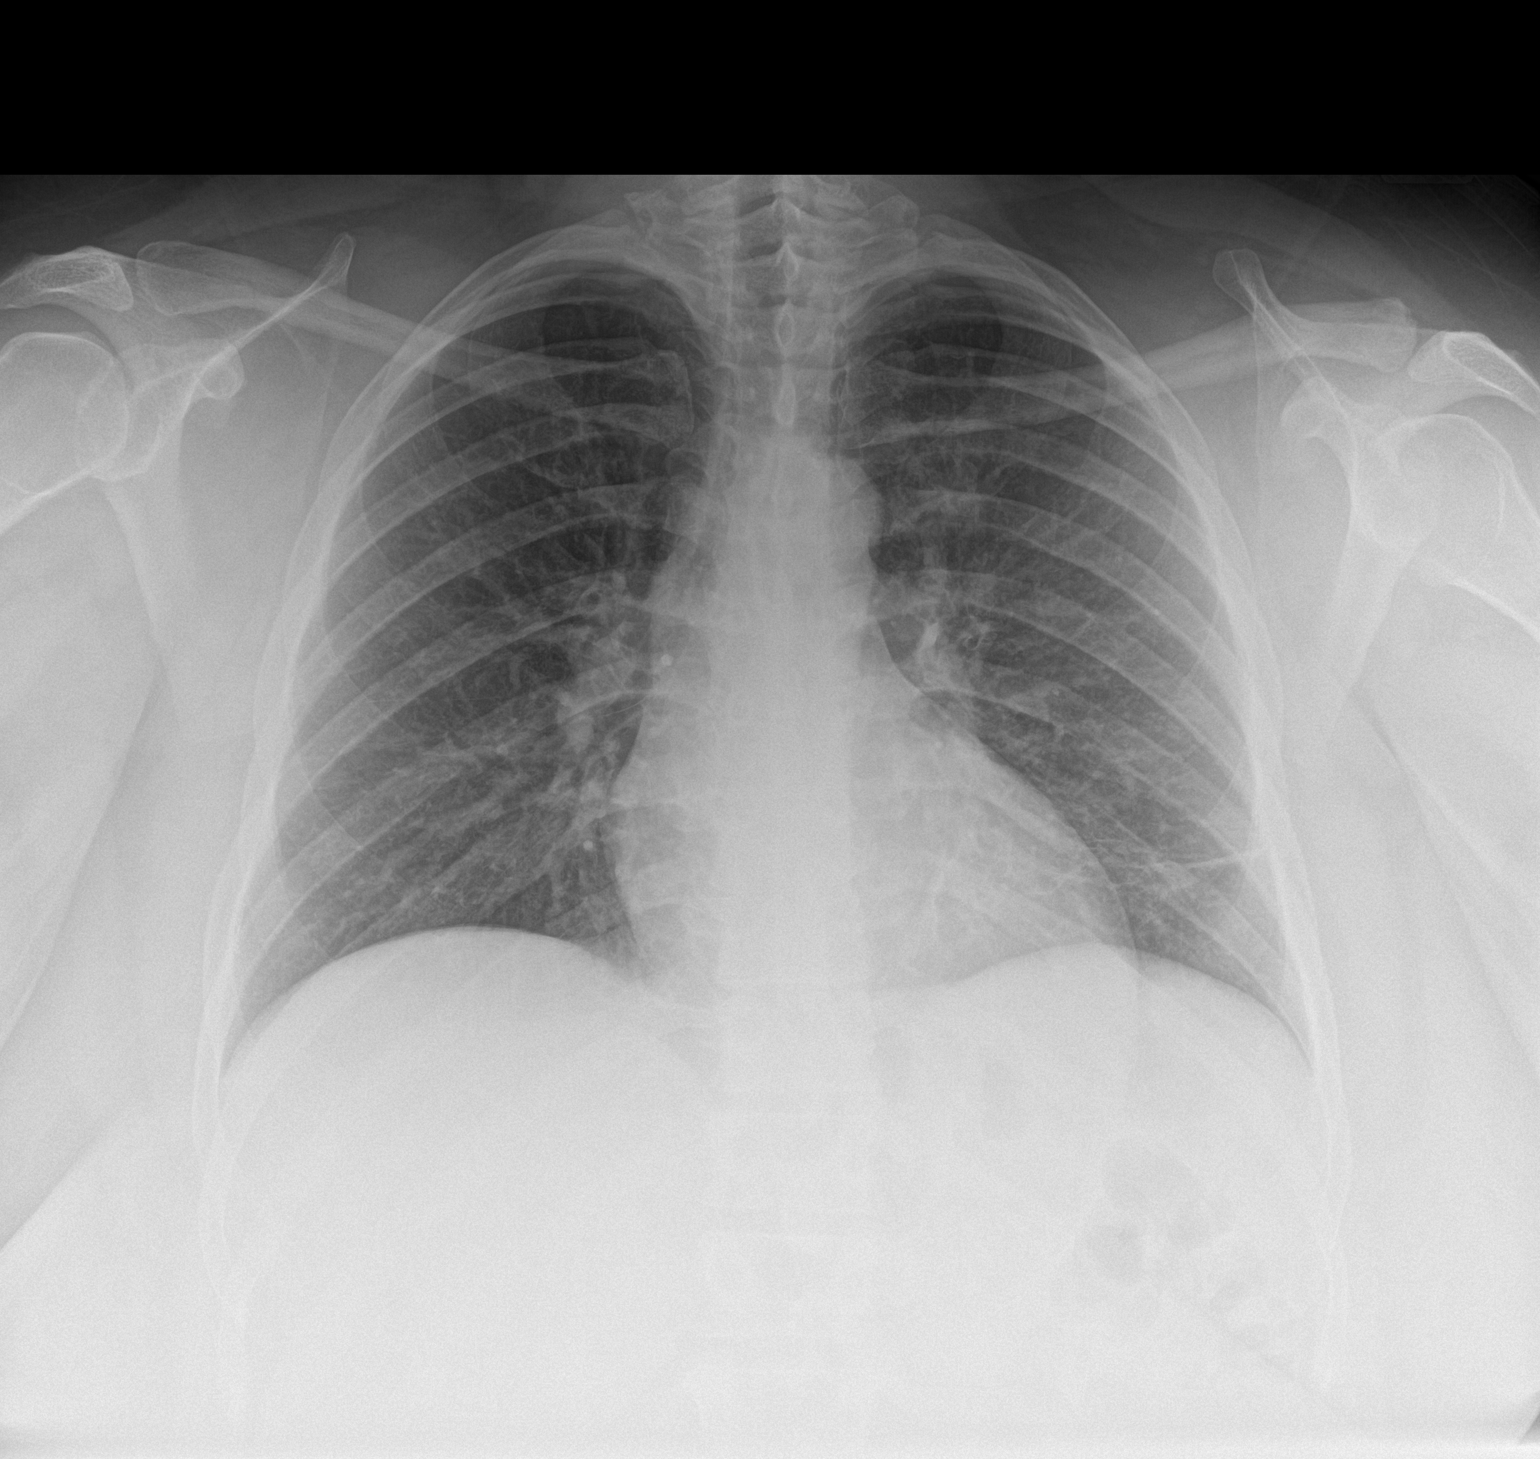

[chest lat]
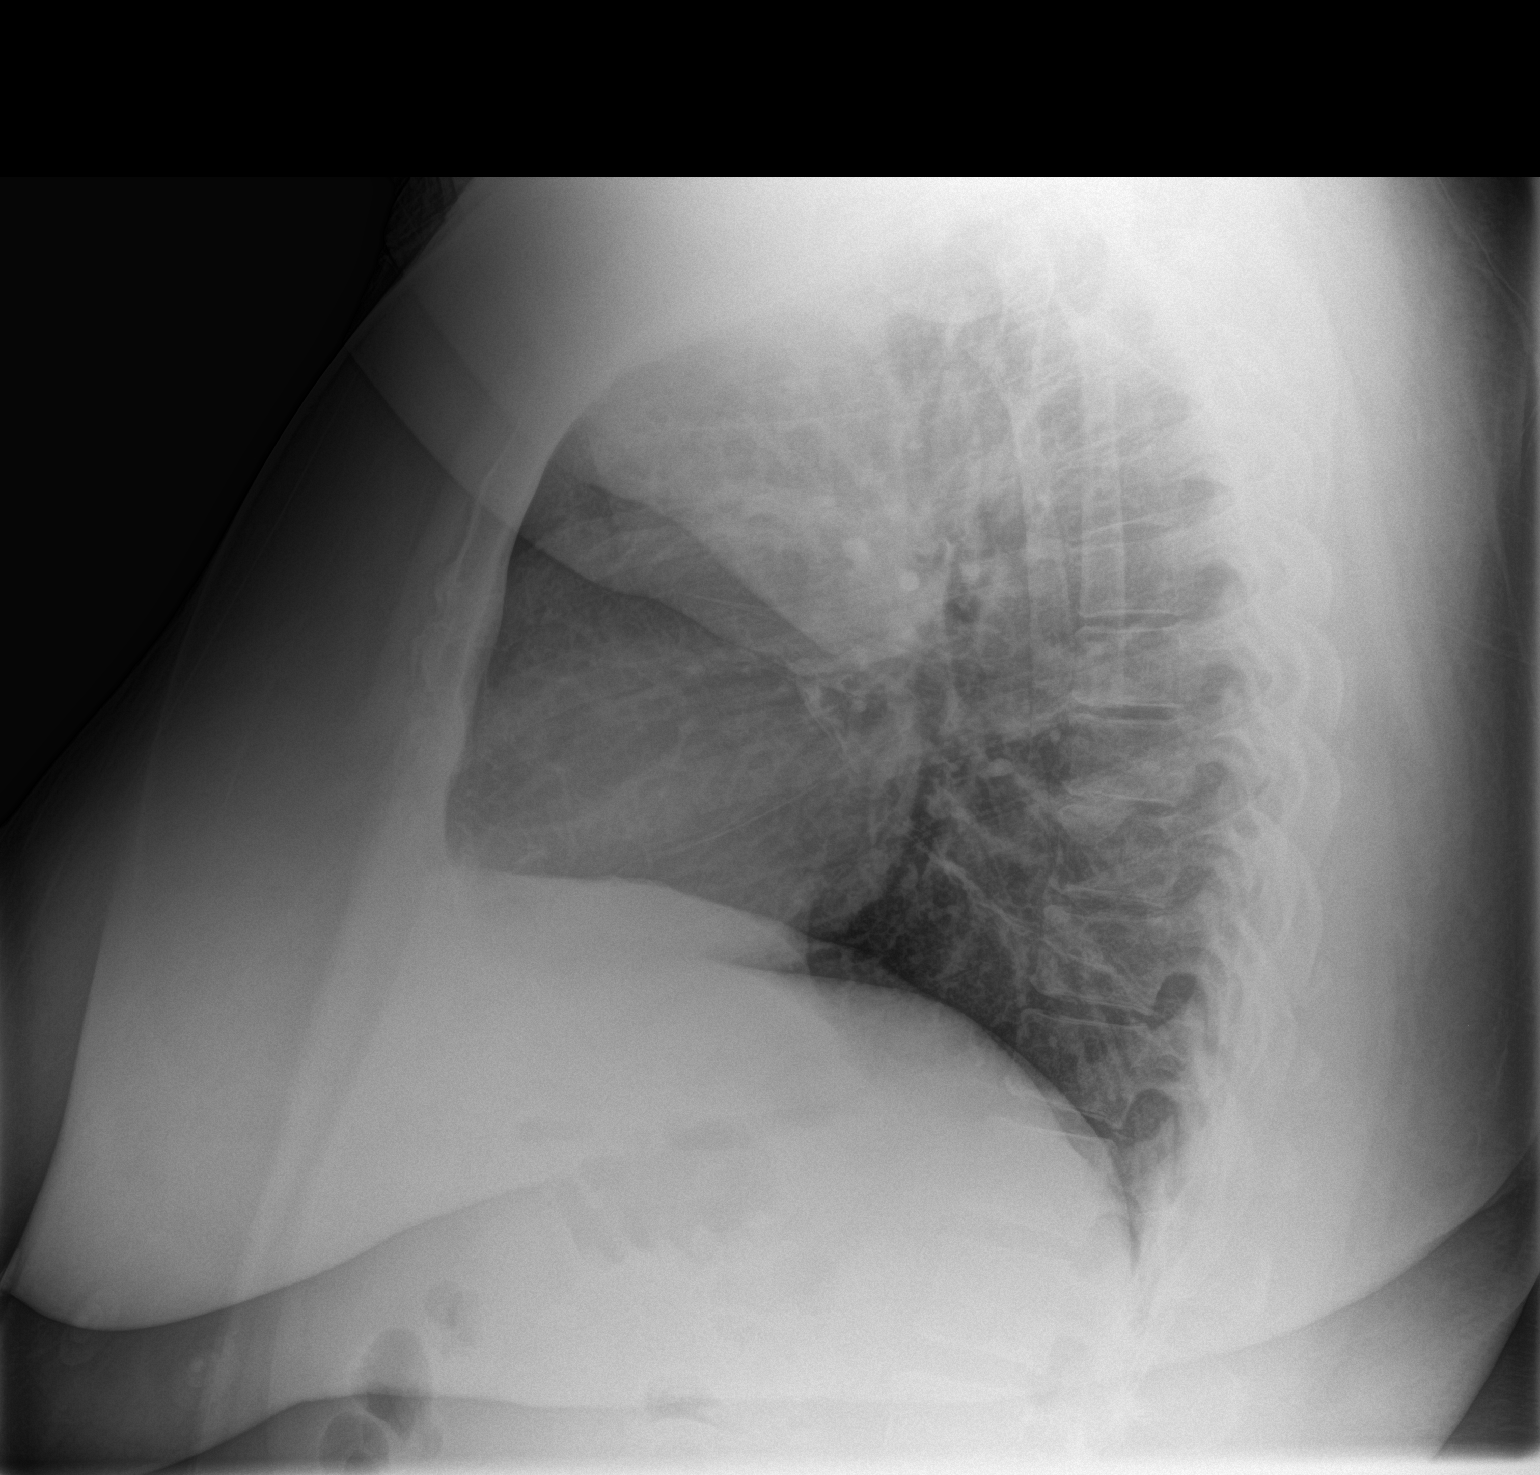

[2 of 2 positions shown; findings below may reference images not displayed]

FINDINGS: Normal heart size and mediastinal contours. Minimal atelectasis or
scar at the left base. No consolidation or edema. No effusion or
pneumothorax. No acute osseous findings.
IMPRESSION: Mild atelectasis or scarring at the left base.

## 2015-07-12 ENCOUNTER — Encounter: Payer: Self-pay | Admitting: Internal Medicine

## 2015-07-12 ENCOUNTER — Ambulatory Visit (INDEPENDENT_AMBULATORY_CARE_PROVIDER_SITE_OTHER): Payer: Medicaid Other | Admitting: Internal Medicine

## 2015-07-12 VITALS — BP 119/69 | HR 89 | Temp 97.5°F | Wt 339.0 lb

## 2015-07-12 DIAGNOSIS — Z7251 High risk heterosexual behavior: Secondary | ICD-10-CM | POA: Diagnosis present

## 2015-07-12 LAB — COMPLETE METABOLIC PANEL WITH GFR
ALBUMIN: 4 g/dL (ref 3.6–5.1)
ALK PHOS: 113 U/L (ref 33–115)
ALT: 21 U/L (ref 6–29)
AST: 25 U/L (ref 10–30)
BILIRUBIN TOTAL: 0.6 mg/dL (ref 0.2–1.2)
BUN: 11 mg/dL (ref 7–25)
CO2: 25 mmol/L (ref 20–31)
CREATININE: 0.63 mg/dL (ref 0.50–1.10)
Calcium: 9.1 mg/dL (ref 8.6–10.2)
Chloride: 101 mmol/L (ref 98–110)
GFR, Est African American: >89 mL/min (ref >=60)
GFR, Est Non African American: >89 mL/min (ref >=60)
GLUCOSE: 111 mg/dL — AB (ref 65–99)
Potassium: 4.2 mmol/L (ref 3.5–5.3)
SODIUM: 135 mmol/L (ref 135–146)
Total Protein: 7.4 g/dL (ref 6.1–8.1)

## 2015-07-12 LAB — CBC WITH DIFFERENTIAL/PLATELET
BASOS ABS: 0 {cells}/uL (ref 0–200)
BASOS PCT: 0 %
Eosinophils Absolute: 80 cells/uL (ref 15–500)
Eosinophils Relative: 1 %
HCT: 40 % (ref 35.0–45.0)
HEMOGLOBIN: 13.3 g/dL (ref 11.7–15.5)
LYMPHS ABS: 2480 {cells}/uL (ref 850–3900)
Lymphocytes Relative: 31 %
MCH: 28.2 pg (ref 27.0–33.0)
MCHC: 33.3 g/dL (ref 32.0–36.0)
MCV: 84.9 fL (ref 80.0–100.0)
MONOS PCT: 7 %
MPV: 9.6 fL (ref 7.5–12.5)
Monocytes Absolute: 560 cells/uL (ref 200–950)
NEUTROS ABS: 4880 {cells}/uL (ref 1500–7800)
Neutrophils Relative %: 61 %
PLATELETS: 307 10*3/uL (ref 140–400)
RBC: 4.71 MIL/uL (ref 3.80–5.10)
RDW: 15.1 % — ABNORMAL HIGH (ref 11.0–15.0)
WBC: 8 10*3/uL (ref 3.8–10.8)

## 2015-07-12 LAB — HIV ANTIBODY (ROUTINE TESTING W REFLEX): HIV 1&2 Ab, 4th Generation: NONREACTIVE

## 2015-07-12 NOTE — Progress Notes (Signed)
  RFV: follow up for PrEP Subjective:    Patient ID: Christine Carlson, female    DOB: January 04, 1976, 40 y.o.   MRN: [id number]  HPI  40yo F who is HIV negative, taking PrEP since she is in relationship with HIV + female, on treatment. She states she has good adherence to medications. 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 Melvyn Novasrtmann. Otherwise, she is doing pretty well. Cares for her 4 kids 50( 9 yo, twin 7371yr old, and 383 yr old daughter) with her husband. No recent illnesses.  Allergies  Allergen Reactions  . Cephalexin Rash    Rash on tongue and back of mouth   Current Outpatient Prescriptions on File Prior to Visit  Medication Sig Dispense Refill  . canagliflozin (INVOKANA) 100 MG TABS tablet Take 100 mg by mouth daily.    . clonazePAM (KLONOPIN) 0.5 MG tablet Take 0.5 mg by mouth 2 (two) times daily as needed for anxiety.    . diphenhydrAMINE (SOMINEX) 25 MG tablet Take 25 mg by mouth daily as needed for itching or sleep.    Marland Kitchen. emtricitabine-tenofovir (TRUVADA) 200-300 MG tablet Take 1 tablet by mouth daily. 30 tablet 11  . lisinopril-hydrochlorothiazide (PRINZIDE,ZESTORETIC) 20-12.5 MG per tablet Take 2 tablets by mouth daily.    . metoprolol succinate (TOPROL-XL) 50 MG 24 hr tablet Take 50 mg by mouth daily. Take with or immediately following a meal.    . mometasone (NASONEX) 50 MCG/ACT nasal spray Place 2 sprays into the nose daily.    . montelukast (SINGULAIR) 10 MG tablet Take 10 mg by mouth at bedtime.    . Multiple Vitamin (MULTIVITAMIN) tablet Take 1 tablet by mouth daily.    . sitaGLIPtin-metformin (JANUMET) 50-1000 MG per tablet Take 1 tablet by mouth 2 (two) times daily with a meal.    . valACYclovir (VALTREX) 500 MG tablet Take 500 mg by mouth daily.      No current facility-administered medications on file prior to visit.     Review of Systems 10  point ros is otherwise negative    Objective:   Physical Exam BP 119/69 mmHg  Pulse 89  Temp(Src) 97.5 F (36.4 C) (Oral)  Wt 339 lb (153.769 kg) Physical Exam  Constitutional:  oriented to person, place, and time. appears well-developed and well-nourished. No distress.  HENT: [place]/AT, PERRLA, no scleral icterus Mouth/Throat: Oropharynx is clear and moist. No oropharyngeal exudate.  Cardiovascular: Normal rate, regular rhythm and normal heart sounds. Exam reveals no gallop and no friction rub.  No murmur heard.  Pulmonary/Chest: Effort normal and breath sounds normal. No respiratory distress.  has no wheezes.  Neck = supple, no nuchal rigidity Ext: right middle finger slight curvature, unable to straighten during extention Skin: Skin is warm and dry. No rash noted. No erythema.  Psychiatric: a normal mood and affect.  behavior is normal.       Assessment & Plan:  PrEP = will check labs. Will refill truvada.  DM = continue on her current regimen  htn = under good control. Continue on current regimen  rtc in 6 months

## 2015-07-13 LAB — RPR

## 2015-10-21 ENCOUNTER — Ambulatory Visit: Payer: Medicaid Other

## 2015-12-02 ENCOUNTER — Telehealth: Payer: Self-pay | Admitting: *Deleted

## 2015-12-02 ENCOUNTER — Other Ambulatory Visit: Payer: Self-pay | Admitting: *Deleted

## 2015-12-02 DIAGNOSIS — Z79899 Other long term (current) drug therapy: Secondary | ICD-10-CM

## 2015-12-02 MED ORDER — EMTRICITABINE-TENOFOVIR DF 200-300 MG PO TABS: 1.0000 | ORAL_TABLET | Freq: Every day | ORAL | 11 refills | Status: DC

## 2015-12-02 MED ORDER — EMTRICITABINE-TENOFOVIR DF 200-300 MG PO TABS: 1.0000 | ORAL_TABLET | Freq: Every day | ORAL | 1 refills | Status: DC

## 2015-12-02 NOTE — Telephone Encounter (Signed)
Patient called requesting a refill on her Truvada. She has a follow up appt with Dr. Drue SecondSnider on 01/17/16 and the pharmacy could not fill her Rx due to a misspelling of her name. I tried to call [facility] in [place] and the phone # is not working, so I call [place] [facility] and gave a verbal. I advised patient to let [facility] know that they do not have the correct spelling of her name.

## 2016-01-17 ENCOUNTER — Encounter: Payer: Self-pay | Admitting: Internal Medicine

## 2016-01-17 ENCOUNTER — Ambulatory Visit (INDEPENDENT_AMBULATORY_CARE_PROVIDER_SITE_OTHER): Payer: BLUE CROSS/BLUE SHIELD | Admitting: Internal Medicine

## 2016-01-17 VITALS — BP 146/84 | HR 88 | Temp 99.1°F | Ht 66.5 in | Wt 333.0 lb

## 2016-01-17 DIAGNOSIS — Z79899 Other long term (current) drug therapy: Secondary | ICD-10-CM | POA: Diagnosis not present

## 2016-01-17 DIAGNOSIS — Z0000 Encounter for general adult medical examination without abnormal findings: Secondary | ICD-10-CM

## 2016-01-17 LAB — COMPLETE METABOLIC PANEL WITH GFR
ALBUMIN: 3.9 g/dL (ref 3.6–5.1)
ALK PHOS: 104 U/L (ref 33–115)
ALT: 17 U/L (ref 6–29)
AST: 21 U/L (ref 10–30)
BILIRUBIN TOTAL: 0.8 mg/dL (ref 0.2–1.2)
BUN: 11 mg/dL (ref 7–25)
CO2: 28 mmol/L (ref 20–31)
CREATININE: 0.72 mg/dL (ref 0.50–1.10)
Calcium: 9.2 mg/dL (ref 8.6–10.2)
Chloride: 102 mmol/L (ref 98–110)
GLUCOSE: 104 mg/dL — AB (ref 65–99)
Potassium: 4.2 mmol/L (ref 3.5–5.3)
SODIUM: 139 mmol/L (ref 135–146)
TOTAL PROTEIN: 7.2 g/dL (ref 6.1–8.1)

## 2016-01-17 LAB — CBC WITH DIFFERENTIAL/PLATELET
BASOS PCT: 0 %
Basophils Absolute: 0 cells/uL (ref 0–200)
EOS ABS: 95 {cells}/uL (ref 15–500)
Eosinophils Relative: 1 %
HCT: 39.3 % (ref 35.0–45.0)
HEMOGLOBIN: 12.4 g/dL (ref 11.7–15.5)
LYMPHS ABS: 2565 {cells}/uL (ref 850–3900)
Lymphocytes Relative: 27 %
MCH: 27.6 pg (ref 27.0–33.0)
MCHC: 31.6 g/dL — ABNORMAL LOW (ref 32.0–36.0)
MCV: 87.5 fL (ref 80.0–100.0)
MPV: 9.9 fL (ref 7.5–12.5)
Monocytes Absolute: 855 cells/uL (ref 200–950)
Monocytes Relative: 9 %
NEUTROS ABS: 5985 {cells}/uL (ref 1500–7800)
Neutrophils Relative %: 63 %
PLATELETS: 319 10*3/uL (ref 140–400)
RBC: 4.49 MIL/uL (ref 3.80–5.10)
RDW: 15.2 % — ABNORMAL HIGH (ref 11.0–15.0)
WBC: 9.5 10*3/uL (ref 3.8–10.8)

## 2016-01-17 MED ORDER — EMTRICITABINE-TENOFOVIR DF 200-300 MG PO TABS: 1.0000 | ORAL_TABLET | Freq: Every day | ORAL | 6 refills | Status: DC

## 2016-01-17 NOTE — Progress Notes (Signed)
RFV: follow on up on PrEP  Patient ID: Christine Carlson, female   DOB: 08/01/1975, 40 y.o.   MRN: [id number]  HPI  Christine Carlson is a 40yo F on truvada for PreP, shie is HIV negative with her spouse being HIV positive.she is doing well. She reports missing a few doses in the last 6 months. She has converted to taking meds in the am which helps with adherence  She is in good health. Her girls are also in good health 40 yo, 40 yo twins and 40 yo.  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 atorvastatin (LIPITOR) 20 MG tablet Take 20 mg by mouth daily.  . canagliflozin (INVOKANA) 100 MG TABS tablet Take 100 mg by mouth daily.  . clonazePAM (KLONOPIN) 0.5 MG tablet Take 0.5 mg by mouth 2 (two) times daily as needed for anxiety.  Marland Kitchen. emtricitabine-tenofovir (TRUVADA) 200-300 MG tablet Take 1 tablet by mouth daily.  . fexofenadine (ALLEGRA) 180 MG tablet Take 180 mg by mouth daily.  Marland Kitchen. losartan-hydrochlorothiazide (HYZAAR) 100-25 MG tablet Take 1 tablet by mouth daily.  . metoprolol succinate (TOPROL-XL) 50 MG 24 hr tablet Take 50 mg by mouth daily. Take with or immediately following a meal.  . mometasone (NASONEX) 50 MCG/ACT nasal spray Place 2 sprays into the nose daily.  . montelukast (SINGULAIR) 10 MG tablet Take 10 mg by mouth at bedtime.  . Multiple Vitamin (MULTIVITAMIN) tablet Take 1 tablet by mouth daily.  . sitaGLIPtin-metformin (JANUMET) 50-1000 MG per tablet Take 1 tablet by mouth 2 (two) times daily with a meal.  . valACYclovir (VALTREX) 500 MG tablet Take 500 mg by mouth daily.   . [DISCONTINUED] diphenhydrAMINE (SOMINEX) 25 MG tablet Take 25 mg by mouth daily as needed for itching or sleep.  . [DISCONTINUED] lisinopril-hydrochlorothiazide (PRINZIDE,ZESTORETIC) 20-12.5 MG per tablet Take 2 tablets by mouth daily.   No facility-administered encounter  medications on file as of 01/17/2016.      Patient Active Problem List   Diagnosis Date Noted  . H/O seasonal allergies 12/04/2014  . H/O herpes simplex infection 12/04/2014     Health Maintenance Due  Topic Date Due  . TETANUS/TDAP  02/13/1994  . PAP SMEAR  02/14/1996  . INFLUENZA VACCINE  08/30/2015     Review of Systems  Constitutional: Negative for fever, chills, diaphoresis, activity change, appetite change, fatigue and unexpected weight change.  HENT: Negative for congestion, sore throat, rhinorrhea, sneezing, trouble swallowing and sinus pressure.  Eyes: Negative for photophobia and visual disturbance.  Respiratory: Negative for cough, chest tightness, shortness of breath, wheezing and stridor.  Cardiovascular: Negative for chest pain, palpitations and leg swelling.  Gastrointestinal: Negative for nausea, vomiting, abdominal pain, diarrhea, constipation, blood in stool, abdominal distention and anal bleeding.  Genitourinary: Negative for dysuria, hematuria, flank pain and difficulty urinating.  Musculoskeletal: Negative for myalgias, back pain, joint swelling, arthralgias and gait problem.  Skin: Negative for color change, pallor, rash and wound.  Neurological: Negative for dizziness, tremors, weakness and light-headedness.  Hematological: Negative for adenopathy. Does not bruise/bleed easily.  Psychiatric/Behavioral: Negative for behavioral problems, confusion, sleep disturbance, dysphoric mood, decreased concentration and agitation.    Physical Exam   BP (!) 146/84   Pulse 88   Temp 99.1 F (37.3 C) (Oral)   Ht 5' 6.5" (1.689 m)   Wt (!) 333 lb (151 kg)  BMI 52.94 kg/m   Constitutional:  oriented to person, place, and time. appears well-developed and well-nourished. No distress.  HENT: [place]/AT, PERRLA, no scleral icterus Mouth/Throat: Oropharynx is clear and moist. No oropharyngeal exudate.  Cardiovascular: Normal rate, regular rhythm and normal heart sounds.  Exam reveals no gallop and no friction rub.  No murmur heard.  Pulmonary/Chest: Effort normal and breath sounds normal. No respiratory distress.  has no wheezes.  Neck = supple, no nuchal rigidity Abdominal: Soft. Bowel sounds are normal.  exhibits no distension. There is no tenderness.  Lymphadenopathy: no cervical adenopathy. No axillary adenopathy Neurological: alert and oriented to person, place, and time.  Skin: Skin is warm and dry. No rash noted. No erythema.  Psychiatric: a normal mood and affect.  behavior is normal.   Lab Results  Component Value Date   HIV1RNAQUANT <20 12/02/2014   Lab Results  Component Value Date   HEPBSAB POS (A) 12/02/2014   No results found for: RPR  CBC Lab Results  Component Value Date   WBC 8.0 07/12/2015   RBC 4.71 07/12/2015   HGB 13.3 07/12/2015   HCT 40.0 07/12/2015   PLT 307 07/12/2015   MCV 84.9 07/12/2015   MCH 28.2 07/12/2015   MCHC 33.3 07/12/2015   RDW 15.1 (H) 07/12/2015   LYMPHSABS 2,480 07/12/2015   MONOABS 560 07/12/2015   EOSABS 80 07/12/2015   BASOSABS 0 07/12/2015   BMET Lab Results  Component Value Date   NA 135 07/12/2015   K 4.2 07/12/2015   CL 101 07/12/2015   CO2 25 07/12/2015   GLUCOSE 111 (H) 07/12/2015   BUN 11 07/12/2015   CREATININE 0.63 07/12/2015   CALCIUM 9.1 07/12/2015   GFRNONAA >89 07/12/2015   GFRAA >89 07/12/2015     Assessment and Plan  PREP = will do labs today and urine test for STI.

## 2016-01-18 LAB — URINE CYTOLOGY ANCILLARY ONLY
CHLAMYDIA, DNA PROBE: NEGATIVE
Neisseria Gonorrhea: NEGATIVE

## 2016-01-18 LAB — HIV ANTIBODY (ROUTINE TESTING W REFLEX): HIV 1&2 Ab, 4th Generation: NONREACTIVE

## 2016-06-28 ENCOUNTER — Other Ambulatory Visit: Payer: BLUE CROSS/BLUE SHIELD

## 2016-06-28 ENCOUNTER — Other Ambulatory Visit: Payer: Self-pay

## 2016-06-28 DIAGNOSIS — Z0000 Encounter for general adult medical examination without abnormal findings: Secondary | ICD-10-CM

## 2016-06-28 DIAGNOSIS — Z113 Encounter for screening for infections with a predominantly sexual mode of transmission: Secondary | ICD-10-CM

## 2016-06-28 LAB — BASIC METABOLIC PANEL
BUN: 10 mg/dL (ref 7–25)
CALCIUM: 8.9 mg/dL (ref 8.6–10.2)
CHLORIDE: 105 mmol/L (ref 98–110)
CO2: 24 mmol/L (ref 20–31)
CREATININE: 0.61 mg/dL (ref 0.50–1.10)
Glucose, Bld: 159 mg/dL — ABNORMAL HIGH (ref 65–99)
Potassium: 4.1 mmol/L (ref 3.5–5.3)
Sodium: 140 mmol/L (ref 135–146)

## 2016-06-29 LAB — URINE CYTOLOGY ANCILLARY ONLY
CHLAMYDIA, DNA PROBE: NEGATIVE
NEISSERIA GONORRHEA: NEGATIVE

## 2016-06-29 LAB — RPR

## 2016-06-29 LAB — HIV ANTIBODY (ROUTINE TESTING W REFLEX): HIV 1&2 Ab, 4th Generation: NONREACTIVE

## 2016-07-12 ENCOUNTER — Encounter: Payer: Self-pay | Admitting: Internal Medicine

## 2016-07-12 ENCOUNTER — Ambulatory Visit (INDEPENDENT_AMBULATORY_CARE_PROVIDER_SITE_OTHER): Payer: BLUE CROSS/BLUE SHIELD | Admitting: Internal Medicine

## 2016-07-12 VITALS — BP 137/89 | HR 99 | Temp 98.4°F | Wt 350.0 lb

## 2016-07-12 DIAGNOSIS — Z713 Dietary counseling and surveillance: Secondary | ICD-10-CM | POA: Diagnosis not present

## 2016-07-12 DIAGNOSIS — Z79899 Other long term (current) drug therapy: Secondary | ICD-10-CM

## 2016-07-12 DIAGNOSIS — Z7251 High risk heterosexual behavior: Secondary | ICD-10-CM | POA: Diagnosis not present

## 2016-07-12 NOTE — Progress Notes (Signed)
RFV: PREP Patient ID: Christine Carlson, female   DOB: 05/07/1975, 41 y.o.   MRN: [id number]  HPI 41yo F with DM, HTN, HLD, who is in a serodifferent relationship with her husband who is hiv +, she has been on truvada for PrEP for the last 2 years. In monogamous relationship. She has not had any difficulty with taking medication. Not missing any doses. No other relationship besides her significant other. No flu like illness. Working on losing weight. Her DM is well controlled with HGA1C at 6.5. 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 atorvastatin (LIPITOR) 20 MG tablet Take 20 mg by mouth daily.  . canagliflozin (INVOKANA) 100 MG TABS tablet Take 100 mg by mouth daily.  . clonazePAM (KLONOPIN) 0.5 MG tablet Take 0.5 mg by mouth 2 (two) times daily as needed for anxiety.  Marland Kitchen. emtricitabine-tenofovir (TRUVADA) 200-300 MG tablet Take 1 tablet by mouth daily.  . fexofenadine (ALLEGRA) 180 MG tablet Take 180 mg by mouth daily.  Marland Kitchen. losartan-hydrochlorothiazide (HYZAAR) 100-25 MG tablet Take 1 tablet by mouth daily.  . metoprolol succinate (TOPROL-XL) 50 MG 24 hr tablet Take 50 mg by mouth daily. Take with or immediately following a meal.  . mometasone (NASONEX) 50 MCG/ACT nasal spray Place 2 sprays into the nose daily.  . montelukast (SINGULAIR) 10 MG tablet Take 10 mg by mouth at bedtime.  . Multiple Vitamin (MULTIVITAMIN) tablet Take 1 tablet by mouth daily.  . sitaGLIPtin-metformin (JANUMET) 50-1000 MG per tablet Take 1 tablet by mouth 2 (two) times daily with a meal.  . valACYclovir (VALTREX) 500 MG tablet Take 500 mg by mouth daily.    No facility-administered encounter medications on file as of 07/12/2016.      Patient Active Problem List   Diagnosis Date Noted  . H/O seasonal allergies 12/04/2014  . H/O herpes simplex infection 12/04/2014     Health Maintenance Due  Topic Date Due    . TETANUS/TDAP  02/13/1994  . PAP SMEAR  02/14/1996    Soc hx: Social History  Substance Use Topics  . Smoking status: Never Smoker  . Smokeless tobacco: Never Used  . Alcohol use No   Review of Systems  Constitutional: Negative for fever, chills, diaphoresis, activity change, appetite change, fatigue and unexpected weight change.  HENT: Negative for congestion, sore throat, rhinorrhea, sneezing, trouble swallowing and sinus pressure.  Eyes: Negative for photophobia and visual disturbance.  Respiratory: Negative for cough, chest tightness, shortness of breath, wheezing and stridor.  Cardiovascular: Negative for chest pain, palpitations and leg swelling.  Gastrointestinal: Negative for nausea, vomiting, abdominal pain, diarrhea, constipation, blood in stool, abdominal distention and anal bleeding.  Genitourinary: Negative for dysuria, hematuria, flank pain and difficulty urinating.  Musculoskeletal: Negative for myalgias, back pain, joint swelling, arthralgias and gait problem.  Skin: Negative for color change, pallor, rash and wound.  Neurological: Negative for dizziness, tremors, weakness and light-headedness.  Hematological: Negative for adenopathy. Does not bruise/bleed easily.  Psychiatric/Behavioral: Negative for behavioral problems, confusion, sleep disturbance, dysphoric mood, decreased concentration and agitation.    Physical Exam   BP 137/89   Pulse 99   Temp 98.4 F (36.9 C) (Oral)   Wt (!) 350 lb (158.8 kg)   BMI 55.65 kg/m   Physical Exam  Constitutional:  oriented to person, place, and time. appears well-developed and well-nourished. No distress.  HENT: [place]/AT, PERRLA, no scleral icterus Mouth/Throat: Oropharynx is  clear and moist. No oropharyngeal exudate.  Lymphadenopathy: no cervical adenopathy. No axillary adenopathy Neurological: alert and oriented to person, place, and time.  Skin: Skin is warm and dry. No rash noted. No erythema.  Psychiatric: a normal  mood and affect.  behavior is normal.    Lab Results  Component Value Date   HIV1RNAQUANT <20 12/02/2014   Lab Results  Component Value Date   HEPBSAB POS (A) 12/02/2014   Lab Results  Component Value Date   LABRPR NON REAC 06/28/2016    CBC Lab Results  Component Value Date   WBC 9.5 01/17/2016   RBC 4.49 01/17/2016   HGB 12.4 01/17/2016   HCT 39.3 01/17/2016   PLT 319 01/17/2016   MCV 87.5 01/17/2016   MCH 27.6 01/17/2016   MCHC 31.6 (L) 01/17/2016   RDW 15.2 (H) 01/17/2016   LYMPHSABS 2,565 01/17/2016   MONOABS 855 01/17/2016   EOSABS 95 01/17/2016    BMET Lab Results  Component Value Date   NA 140 06/28/2016   K 4.1 06/28/2016   CL 105 06/28/2016   CO2 24 06/28/2016   GLUCOSE 159 (H) 06/28/2016   BUN 10 06/28/2016   CREATININE 0.61 06/28/2016   CALCIUM 8.9 06/28/2016   GFRNONAA >89 01/17/2016   GFRAA >89 01/17/2016   hiv testing on 5/31 negative rpr testing on 5/31 negative sti testing on 5/31 negative   Assessment and Plan   PrEP = will continue with truvada. Return back in 3 months for testing. See back in 6 months  Long term medication monitoring = no change in creatinine  Weight loss counseling = agreed with diet modification, attemp to decrease sugars and carbs

## 2016-07-12 NOTE — Patient Instructions (Signed)
Come back for lab work in 3 months  And then  Repeat visit in 6 months

## 2016-10-15 ENCOUNTER — Other Ambulatory Visit: Payer: BLUE CROSS/BLUE SHIELD

## 2016-12-12 ENCOUNTER — Telehealth: Payer: Self-pay | Admitting: *Deleted

## 2016-12-12 NOTE — Telephone Encounter (Signed)
Made patient a lab appointment for tomorrow. Christine Carlson

## 2016-12-12 NOTE — Telephone Encounter (Signed)
She needs to come back for labs. 4:45 will be a problem because labs will likely be gone, therefore, she needs to come back at an earlier time.

## 2016-12-12 NOTE — Telephone Encounter (Signed)
Call from patient stating she took her last pill Truvada and was not aware she was out of refills. She was suppose to follow up September 17 for lab work and did not keep appt. Advised she needed to come in and see pharmacy and have labs drawn. She said it would have to be a 4:45 appt because [place]. Please advise on refill. Wendall MolaJacqueline Cockerham

## 2016-12-13 ENCOUNTER — Other Ambulatory Visit: Payer: BC Managed Care – PPO

## 2016-12-13 ENCOUNTER — Other Ambulatory Visit (HOSPITAL_COMMUNITY)
Admission: RE | Admit: 2016-12-13 | Discharge: 2016-12-13 | Disposition: A | Discharge status: Home / Self Care | Payer: BC Managed Care – PPO | Source: Ambulatory Visit | Attending: Internal Medicine | Admitting: Internal Medicine

## 2016-12-13 ENCOUNTER — Other Ambulatory Visit: Payer: Self-pay | Admitting: Internal Medicine

## 2016-12-13 DIAGNOSIS — Z7251 High risk heterosexual behavior: Secondary | ICD-10-CM

## 2016-12-17 LAB — BASIC METABOLIC PANEL
BUN: 14 mg/dL (ref 7–25)
CALCIUM: 9.9 mg/dL (ref 8.6–10.2)
CHLORIDE: 102 mmol/L (ref 98–110)
CO2: 28 mmol/L (ref 20–32)
Creat: 0.62 mg/dL (ref 0.50–1.10)
GLUCOSE: 184 mg/dL — AB (ref 65–99)
Potassium: 4.3 mmol/L (ref 3.5–5.3)
Sodium: 138 mmol/L (ref 135–146)

## 2016-12-17 LAB — HIV ANTIBODY (ROUTINE TESTING W REFLEX): HIV 1&2 Ab, 4th Generation: NONREACTIVE

## 2016-12-17 LAB — URINE CYTOLOGY ANCILLARY ONLY
Chlamydia: NEGATIVE
Neisseria Gonorrhea: NEGATIVE
TRICH (WINDOWPATH): NEGATIVE

## 2016-12-17 LAB — FLUORESCENT TREPONEMAL AB(FTA)-IGG-BLD: Fluorescent Treponemal ABS: REACTIVE — AB

## 2016-12-17 LAB — RPR TITER: RPR Titer: 1:1 {titer} — ABNORMAL HIGH

## 2016-12-17 LAB — RPR: RPR Ser Ql: REACTIVE — AB

## 2016-12-19 ENCOUNTER — Telehealth: Payer: Self-pay | Admitting: *Deleted

## 2016-12-19 NOTE — Telephone Encounter (Signed)
Patient called for her lab results and is asking if she can have refills of Truvada. RN relayed results, patient states she was treated with bicillin as a partner exposure to syphilis about 8 years ago. Please advise about the RPR and Truvada refill. Andree CossHowell, Jazier Mcglamery M, RN

## 2016-12-19 NOTE — Telephone Encounter (Signed)
Patient called back asking for her husband's lab results. Advised I do not see a signed consent to disclose this and she called her husband and had a three way conversation with me. His RPR 06/2016 was non reactive. She said, "no one ever told me in my life that I had this disease". Advised we are still waiting on Dr. Drue SecondSnider to read these phone messages and more than likely it will not be addressed until Monday 12/24/16; as she is not in clinic. Husband said, "see this makes no sense". She asked if there could be any other cause for this result and said lab error is always possible; however we could no order another test without MD approval. Did let her know that the [facility] and [facility] do STD tests and they may be open on Friday.

## 2016-12-26 ENCOUNTER — Telehealth: Payer: Self-pay | Admitting: *Deleted

## 2016-12-26 NOTE — Telephone Encounter (Signed)
Patient had labs 11/15 and is scheduled for follow up 12/18 with you.  Ok to refill Truvada now or wait until follow up? Andree CossHowell, Jenie Parish M, RN

## 2016-12-27 ENCOUNTER — Other Ambulatory Visit: Payer: Self-pay | Admitting: *Deleted

## 2016-12-27 DIAGNOSIS — Z79899 Other long term (current) drug therapy: Secondary | ICD-10-CM

## 2016-12-27 MED ORDER — EMTRICITABINE-TENOFOVIR DF 200-300 MG PO TABS: 1.0000 | ORAL_TABLET | Freq: Every day | ORAL | 2 refills | Status: DC

## 2016-12-27 NOTE — Telephone Encounter (Signed)
[facility] [place]

## 2016-12-27 NOTE — Telephone Encounter (Signed)
Yes, Ok to refill truvada

## 2017-01-01 ENCOUNTER — Ambulatory Visit: Payer: BC Managed Care – PPO

## 2017-01-01 ENCOUNTER — Ambulatory Visit (INDEPENDENT_AMBULATORY_CARE_PROVIDER_SITE_OTHER): Payer: BC Managed Care – PPO | Admitting: Pharmacist Clinician (PhC)/ Clinical Pharmacy Specialist

## 2017-01-01 ENCOUNTER — Other Ambulatory Visit: Payer: BLUE CROSS/BLUE SHIELD

## 2017-01-01 DIAGNOSIS — Z79899 Other long term (current) drug therapy: Secondary | ICD-10-CM | POA: Diagnosis not present

## 2017-01-01 MED ORDER — EMTRICITABINE-TENOFOVIR DF 200-300 MG PO TABS: 1.0000 | ORAL_TABLET | Freq: Every day | ORAL | 2 refills | Status: DC

## 2017-01-01 NOTE — Patient Instructions (Signed)
[facility] [address]

## 2017-01-01 NOTE — Progress Notes (Signed)
HPI: Christine Carlson is a 41 y.o. adult who is here to see pharmacy for her PrEP visit.   Allergies: Allergies  Allergen Reactions  . Cephalexin Rash    Rash on tongue and back of mouth    Vitals:    Past Medical History: Past Medical History:  Diagnosis Date  . Diabetes mellitus without complication (HCC)   . Hypertension     Social History: Social History   Socioeconomic History  . Marital status: Married    Spouse name: Not on file  . Number of children: Not on file  . Years of education: Not on file  . Highest education level: Not on file  Social Needs  . Financial resource strain: Not on file  . Food insecurity - worry: Not on file  . Food insecurity - inability: Not on file  . Transportation needs - medical: Not on file  . Transportation needs - non-medical: Not on file  Occupational History  . Not on file  Tobacco Use  . Smoking status: Never Smoker  . Smokeless tobacco: Never Used  Substance and Sexual Activity  . Alcohol use: No    Alcohol/week: 0.0 oz  . Drug use: No  . Sexual activity: Not on file  Other Topics Concern  . Not on file  Social History Narrative  . Not on file    Current Regimen: Truvada  Labs: HIV 1 RNA Quant (copies/mL)  Date Value  12/02/2014 <20   Hep B S Ab (no units)  Date Value  12/02/2014 POS (A)   Hepatitis B Surface Ag (no units)  Date Value  12/02/2014 NEGATIVE    CrCl: CrCl cannot be calculated (Unknown ideal weight.).  Lipids: No results found for: CHOL, TRIG, HDL, CHOLHDL, VLDL, LDLCALC  Assessment: Christine Carlson was supposed to see Christine Carlson on 12/18 for PrEP. She is in a serodiscondant relationship where her husband is suppressed on ART. She can't come to the upcoming appt with Christine Carlson due to a schedule conflict. Her HIV test was recently tested neg so Christine Carlson sent in a 3 months supply of [facility], therefore, we are going to resend it to the correct  one.  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 Her husband was initially tested positive at dx and he got 3 doses of bicillin. She got one dose for partner treatment. Therefore, it's not surprising for her titer to come back like that. Also double checked with Dr. Luciana Carlson about the syphilis issue.   She will f/u back with us in 3 months. Appt made. She got the flu vaccine already.  Recommendations:  Transfer Truvada x3 mo to the correct pharmacy F/u in 3 mo for PrEP  Christine Carlson, PharmD, BCPS, AAHIVP, CPP Clinical Infectious Disease Pharmacist [facility] 01/01/2017, 2:04 PM

## 2017-01-03 ENCOUNTER — Other Ambulatory Visit: Payer: Self-pay | Admitting: Pharmacist Clinician (PhC)/ Clinical Pharmacy Specialist

## 2017-01-03 ENCOUNTER — Telehealth: Payer: Self-pay | Admitting: *Deleted

## 2017-01-03 DIAGNOSIS — Z79899 Other long term (current) drug therapy: Secondary | ICD-10-CM

## 2017-01-03 MED ORDER — EMTRICITABINE-TENOFOVIR DF 200-300 MG PO TABS: 1.0000 | ORAL_TABLET | Freq: Every day | ORAL | 2 refills | Status: DC

## 2017-01-03 NOTE — Progress Notes (Signed)
Pharmacy claimed not to receive the Rx. Resending it.

## 2017-01-03 NOTE — Telephone Encounter (Signed)
Voice mail from patient stating she met with pharmacist and he told her the [facility] would [facility] She called both and neither has her medications. Please call patient back at [phone]. Christine Carlson

## 2017-01-04 ENCOUNTER — Encounter: Payer: Self-pay | Admitting: *Deleted

## 2017-01-04 DIAGNOSIS — Z7251 High risk heterosexual behavior: Secondary | ICD-10-CM | POA: Insufficient documentation

## 2017-01-15 ENCOUNTER — Ambulatory Visit: Payer: BLUE CROSS/BLUE SHIELD | Admitting: Internal Medicine

## 2017-03-19 ENCOUNTER — Telehealth: Payer: Self-pay | Admitting: *Deleted

## 2017-03-19 NOTE — Telephone Encounter (Signed)
Patient called and advised she has been getting calls from the Health Department about her 1:1 RPR titer. She would like to be retested if possible. She has an appt coming up in March and wants to know if she can have labs before then. Advised her will ask the pharmacy staff as they are over the PreP clinic and give her a call back.

## 2017-03-25 ENCOUNTER — Telehealth: Payer: Self-pay | Admitting: Pharmacist Clinician (PhC)/ Clinical Pharmacy Specialist

## 2017-03-25 NOTE — Telephone Encounter (Signed)
Called to answer her question about the 1:1 RPR result from the health department. This is not a new issue. Her VM is not set up.

## 2017-03-28 ENCOUNTER — Ambulatory Visit (INDEPENDENT_AMBULATORY_CARE_PROVIDER_SITE_OTHER): Payer: BC Managed Care – PPO | Admitting: Pharmacist Clinician (PhC)/ Clinical Pharmacy Specialist

## 2017-03-28 DIAGNOSIS — Z23 Encounter for immunization: Secondary | ICD-10-CM

## 2017-03-28 DIAGNOSIS — Z7251 High risk heterosexual behavior: Secondary | ICD-10-CM

## 2017-03-28 NOTE — Progress Notes (Signed)
Date:  03/28/2017   Insured   [x]    Uninsured  []    HPI  Christine Carlson is a 42 y.o. adult who is here for her 3 mo PrEP visit.   Demographics Race:  Black or African American [2] Marital Status:  Married  Allergies Allergies  Allergen Reactions  . Cephalexin Rash    Rash on tongue and back of mouth    Past Medical History:  Diagnosis Date  . Diabetes mellitus without complication (HCC)   . Hypertension     Outpatient Encounter Medications as of 03/28/2017  Medication Sig  . atorvastatin (LIPITOR) 20 MG tablet Take 20 mg by mouth daily.  . canagliflozin (INVOKANA) 100 MG TABS tablet Take 100 mg by mouth daily.  . clonazePAM (KLONOPIN) 0.5 MG tablet Take 0.5 mg by mouth 2 (two) times daily as needed for anxiety.  Marland Kitchen. emtricitabine-tenofovir (TRUVADA) 200-300 MG tablet Take 1 tablet by mouth daily.  . fexofenadine (ALLEGRA) 180 MG tablet Take 180 mg by mouth daily.  Marland Kitchen. losartan-hydrochlorothiazide (HYZAAR) 100-25 MG tablet Take 1 tablet by mouth daily.  . metoprolol succinate (TOPROL-XL) 50 MG 24 hr tablet Take 50 mg by mouth daily. Take with or immediately following a meal.  . mometasone (NASONEX) 50 MCG/ACT nasal spray Place 2 sprays into the nose daily.  . montelukast (SINGULAIR) 10 MG tablet Take 10 mg by mouth at bedtime.  . Multiple Vitamin (MULTIVITAMIN) tablet Take 1 tablet by mouth daily.  . sitaGLIPtin-metformin (JANUMET) 50-1000 MG per tablet Take 1 tablet by mouth 2 (two) times daily with a meal.  . valACYclovir (VALTREX) 500 MG tablet Take 500 mg by mouth daily.    No facility-administered encounter medications on file as of 03/28/2017.     Social History   Tobacco Use  Smoking Status Never Smoker  Smokeless Tobacco Never Used   Social History   Substance and Sexual Activity  Alcohol Use No  . Alcohol/week: 0.0 oz    Drug use?   Yes []  No [x]   Injectable drug use?   Yes []     No [x]   Sexual History  Missing doses? Yes []    No   [x]   CHL HIV PREP FLOWSHEET RESULTS 03/28/2017  Insurance Status Insured  How did you hear? Husband is a patient here  Gender at birth Female  Gender identity cis-Female  Risk for HIV In sexual relationship with HIV+ partner;Condomless vaginal or anal intercourse  Sex Partners Men only  # sex partners past 3-6 mos 1-3  Sex activity preferences Receptive  Condom use No  Partners genders and ages M 30-49  Treated for STI? No  HIV symptoms? N/A  PrEP Eligibility HIV negative;Substantial risk for HIV  Preg status No  Breastfeeding? No  Paper work received? Yes    Labs: HIV Lab Results  Component Value Date   HIV NON-REACTIVE 12/13/2016   HIV NONREACTIVE 06/28/2016   HIV NONREACTIVE 01/17/2016   HIV NONREACTIVE 07/12/2015   HIV Non Reactive 10/15/2014   HIV NON REACTIVE 10/15/2014    GFR CrCl cannot be calculated (Patient's most recent lab result is older than the maximum 21 days allowed.).  Hepatitis B Lab Results  Component Value Date   HEPBSAB POS (A) 12/02/2014   Lab Results  Component Value Date   HEPBSAG NEGATIVE 12/02/2014    Hepatitis C No results found for: HCVAB  Hepatitis A Lab Results  Component Value Date   HAV NON REACTIVE 12/02/2014    RPR and STI  Lab Results  Component Value Date   LABRPR REACTIVE (A) 12/13/2016   LABRPR NON REAC 06/28/2016   LABRPR NON REAC 07/12/2015   LABRPR Non Reactive 10/15/2014   RPRTITER 1:1 (H) 12/13/2016    STI Results GC CT  12/13/2016 Negative Negative  06/28/2016 Negative Negative  01/17/2016 Negative Negative  10/15/2014 Negative Negative     Assessment  Christine Carlson is married to one of our HIV positive patients. She stated that he is now suppressed. She is here today with 4 kids during the visit. Therefore, we did not use the term HIV during the encounter. She has done well on the Truvada. Apparently, she said that the health department keeps bugging her about the RPR result of 1:1. She received 1 dose of the  bicillin in the past for the partner treatment. Explained to her that her result could go back and forth like that. She is asymptomatic. She doesn't need treatment at this point.   He is hep A titer neg. Will start vaccine series today. F/u with pharmacy in 3 months.    Recommendations   HIV ab, bmet, RPR today Hep A #1 Truvada x 3 mo if neg

## 2017-03-29 ENCOUNTER — Telehealth: Payer: Self-pay | Admitting: Pharmacist Clinician (PhC)/ Clinical Pharmacy Specialist

## 2017-03-29 DIAGNOSIS — Z79899 Other long term (current) drug therapy: Secondary | ICD-10-CM

## 2017-03-29 LAB — BASIC METABOLIC PANEL
BUN: 11 mg/dL (ref 7–25)
CALCIUM: 9.8 mg/dL (ref 8.6–10.2)
CO2: 24 mmol/L (ref 20–32)
Chloride: 103 mmol/L (ref 98–110)
Creat: 0.8 mg/dL (ref 0.50–1.10)
GLUCOSE: 167 mg/dL — AB (ref 65–99)
POTASSIUM: 4.3 mmol/L (ref 3.5–5.3)
Sodium: 138 mmol/L (ref 135–146)

## 2017-03-29 LAB — RPR: RPR Ser Ql: NONREACTIVE

## 2017-03-29 LAB — HIV ANTIBODY (ROUTINE TESTING W REFLEX): HIV: NONREACTIVE

## 2017-03-29 MED ORDER — EMTRICITABINE-TENOFOVIR DF 200-300 MG PO TABS: 1.0000 | ORAL_TABLET | Freq: Every day | ORAL | 2 refills | Status: DC

## 2017-03-29 NOTE — Telephone Encounter (Signed)
HIV ab neg so 3 more months of TRV

## 2017-04-01 ENCOUNTER — Other Ambulatory Visit: Payer: Self-pay | Admitting: Internal Medicine

## 2017-04-01 ENCOUNTER — Ambulatory Visit: Payer: BC Managed Care – PPO

## 2017-04-01 DIAGNOSIS — Z79899 Other long term (current) drug therapy: Secondary | ICD-10-CM

## 2017-06-20 ENCOUNTER — Ambulatory Visit (INDEPENDENT_AMBULATORY_CARE_PROVIDER_SITE_OTHER): Payer: BC Managed Care – PPO | Admitting: Pharmacist

## 2017-06-20 DIAGNOSIS — Z7251 High risk heterosexual behavior: Secondary | ICD-10-CM | POA: Diagnosis not present

## 2017-06-20 NOTE — Progress Notes (Signed)
Date:  06/20/2017   HPI: Christine Carlson is a 42 y.o. adult who presents to the [facility] clinic for her 3 month PrEP follow-up.  Insured      Uninsured     Patient Active Problem List   Diagnosis Date Noted  . High risk sexual behavior 01/04/2017  . H/O seasonal allergies 12/04/2014  . H/O herpes simplex infection 12/04/2014    Patient's Medications  New Prescriptions   No medications on file  Previous Medications   ATORVASTATIN (LIPITOR) 20 MG TABLET    Take 20 mg by mouth daily.   CANAGLIFLOZIN (INVOKANA) 100 MG TABS TABLET    Take 100 mg by mouth daily.   CLONAZEPAM (KLONOPIN) 0.5 MG TABLET    Take 0.5 mg by mouth 2 (two) times daily as needed for anxiety.   FEXOFENADINE (ALLEGRA) 180 MG TABLET    Take 180 mg by mouth daily.   LOSARTAN-HYDROCHLOROTHIAZIDE (HYZAAR) 100-25 MG TABLET    Take 1 tablet by mouth daily.   METOPROLOL SUCCINATE (TOPROL-XL) 50 MG 24 HR TABLET    Take 50 mg by mouth daily. Take with or immediately following a meal.   MOMETASONE (NASONEX) 50 MCG/ACT NASAL SPRAY    Place 2 sprays into the nose daily.   MONTELUKAST (SINGULAIR) 10 MG TABLET    Take 10 mg by mouth at bedtime.   MULTIPLE VITAMIN (MULTIVITAMIN) TABLET    Take 1 tablet by mouth daily.   SITAGLIPTIN-METFORMIN (JANUMET) 50-1000 MG PER TABLET    Take 1 tablet by mouth 2 (two) times daily with a meal.   TRUVADA 200-300 MG TABLET    TAKE ONE TABLET BY MOUTH ONCE DAILY WITH OR WITHOUT FOOD. STORE IN ORIGINAL CONTAINER AT ROOM TEMPERATURE.   VALACYCLOVIR (VALTREX) 500 MG TABLET    Take 500 mg by mouth daily.   Modified Medications   No medications on file  Discontinued Medications   No medications on file    Allergies: Allergies  Allergen Reactions  . Cephalexin Rash    Rash on tongue and back of mouth    Past Medical History: Past Medical History:  Diagnosis Date  . Diabetes mellitus without complication (HCC)   . Hypertension     Social History: Social History    Socioeconomic History  . Marital status: Married    Spouse name: Not on file  . Number of children: Not on file  . Years of education: Not on file  . Highest education level: Not on file  Occupational History  . Not on file  Social Needs  . Financial resource strain: Not on file  . Food insecurity:    Worry: Not on file    Inability: Not on file  . Transportation needs:    Medical: Not on file    Non-medical: Not on file  Tobacco Use  . Smoking status: Never Smoker  . Smokeless tobacco: Never Used  Substance and Sexual Activity  . Alcohol use: No    Alcohol/week: 0.0 oz  . Drug use: No  . Sexual activity: Not on file  Lifestyle  . Physical activity:    Days per week: Not on file    Minutes per session: Not on file  . Stress: Not on file  Relationships  . Social connections:    Talks on phone: Not on file    Gets together: Not on file    Attends religious service: Not on file    Active member of club or organization: Not  on file    Attends meetings of clubs or organizations: Not on file    Relationship status: Not on file  Other Topics Concern  . Not on file  Social History Narrative  . Not on file    CHL HIV PREP FLOWSHEET RESULTS 06/20/2017 03/28/2017  Insurance Status Insured Insured  How did you hear? - Husband is a patient here  Gender at birth Female Female  Gender identity cis-Female cis-Female  Risk for HIV In sexual relationship with HIV+ partner;Condomless vaginal or anal intercourse In sexual relationship with HIV+ partner;Condomless vaginal or anal intercourse  Sex Partners Men only Men only  # sex partners past 3-6 mos 1-3 1-3  Sex activity preferences Receptive Receptive  Condom use No No  Partners genders and ages - M 30-49  Treated for STI? No No  HIV symptoms? N/A N/A  PrEP Eligibility Substantial risk for HIV HIV negative;Substantial risk for HIV  Preg status No No  Breastfeeding? No No  Paper work received? - Yes    Labs:  SCr: Lab  Results  Component Value Date   CREATININE 0.80 03/28/2017   CREATININE 0.62 12/13/2016   CREATININE 0.61 06/28/2016   CREATININE 0.72 01/17/2016   CREATININE 0.63 07/12/2015   HIV Lab Results  Component Value Date   HIV NON-REACTIVE 03/28/2017   HIV NON-REACTIVE 12/13/2016   HIV NONREACTIVE 06/28/2016   HIV NONREACTIVE 01/17/2016   HIV NONREACTIVE 07/12/2015   Hepatitis B Lab Results  Component Value Date   HEPBSAB POS (A) 12/02/2014   HEPBSAG NEGATIVE 12/02/2014   Hepatitis C No results found for: HEPCAB, HCVRNAPCRQN Hepatitis A Lab Results  Component Value Date   HAV NON REACTIVE 12/02/2014   RPR and STI Lab Results  Component Value Date   LABRPR NON-REACTIVE 03/28/2017   LABRPR REACTIVE (A) 12/13/2016   LABRPR NON REAC 06/28/2016   LABRPR NON REAC 07/12/2015   LABRPR Non Reactive 10/15/2014   RPRTITER 1:1 (H) 12/13/2016    STI Results GC CT  12/13/2016 Negative Negative  06/28/2016 Negative Negative  01/17/2016 Negative Negative  10/15/2014 Negative Negative    HIV Pre-Exposure Prophylaxis (PrEP): Patient's risk for HIV: HIV + husband Number of sexual partners in the last 3 months: 1 Condom use? no Symptoms of acute HIV? none Last date of BMET: 03/28/17 Last date of STI testing: 03/28/17 Last negative HIV antibody test: 03/28/17  Assessment: Christine Carlson is here today to follow-up for PrEP.  Her husband is HIV positive and is currently suppressed on Genvoya.  She is doing well on Truvada. She has no side effects or tolerability issues.  She did miss 2-3 doses last month due to forgetting.  She has had some issues with [facility] when refilling her medication. Hopefully we can start filling her Truvada at [facility] in July when we get the [facility] contract.  She does not have any s/sx of HIV or STDs.  She is only sexually active with her husband and they do not use condoms.  Will only check a HIV antibody today as a BMET and STD tests were done at last  visit.  Plan: - Continue Truvada PO once daily - HIV antibody today - 3 more months if HIV negative - F/u with [place] again 8/22 at 10am  Cassie L. Kuppelweiser, PharmD, AAHIVP, CPP Infectious Diseases Clinical Pharmacist [facility] 06/20/2017, 2:33 PM

## 2017-06-21 ENCOUNTER — Telehealth: Payer: Self-pay | Admitting: Pharmacist

## 2017-06-21 DIAGNOSIS — Z79899 Other long term (current) drug therapy: Secondary | ICD-10-CM

## 2017-06-21 LAB — HIV ANTIBODY (ROUTINE TESTING W REFLEX): HIV 1&2 Ab, 4th Generation: NONREACTIVE

## 2017-06-21 MED ORDER — EMTRICITABINE-TENOFOVIR DF 200-300 MG PO TABS: 1.0000 | ORAL_TABLET | Freq: Every day | ORAL | 2 refills | Status: DC

## 2017-06-21 NOTE — Telephone Encounter (Addendum)
Called Christine Carlson to let her know that her HIV antibody was negative.  Will send in 3 more months of Truvada.

## 2017-07-08 ENCOUNTER — Other Ambulatory Visit: Payer: Self-pay | Admitting: Internal Medicine

## 2017-07-08 DIAGNOSIS — Z79899 Other long term (current) drug therapy: Secondary | ICD-10-CM

## 2017-07-24 ENCOUNTER — Telehealth: Payer: Self-pay | Admitting: *Deleted

## 2017-07-24 ENCOUNTER — Other Ambulatory Visit: Payer: Self-pay | Admitting: Pharmacist

## 2017-07-24 DIAGNOSIS — Z79899 Other long term (current) drug therapy: Secondary | ICD-10-CM

## 2017-07-24 MED ORDER — EMTRICITABINE-TENOFOVIR DF 200-300 MG PO TABS: 1.0000 | ORAL_TABLET | Freq: Every day | ORAL | 2 refills | Status: DC

## 2017-07-24 NOTE — Telephone Encounter (Signed)
Called patient back and verified pharmacy... Sent refills to [facility].

## 2017-07-24 NOTE — Telephone Encounter (Signed)
Prep patient calling for a refill advised will have pharmacy give her a call back when they can.

## 2017-09-19 ENCOUNTER — Ambulatory Visit: Payer: BC Managed Care – PPO

## 2017-09-19 ENCOUNTER — Other Ambulatory Visit (HOSPITAL_COMMUNITY)
Admission: RE | Admit: 2017-09-19 | Discharge: 2017-09-19 | Disposition: A | Discharge status: Home / Self Care | Payer: BLUE CROSS/BLUE SHIELD | Source: Ambulatory Visit | Attending: Infectious Disease | Admitting: Infectious Disease

## 2017-09-19 ENCOUNTER — Ambulatory Visit (INDEPENDENT_AMBULATORY_CARE_PROVIDER_SITE_OTHER): Payer: BLUE CROSS/BLUE SHIELD | Admitting: Pharmacist Clinician (PhC)/ Clinical Pharmacy Specialist

## 2017-09-19 DIAGNOSIS — Z7251 High risk heterosexual behavior: Secondary | ICD-10-CM

## 2017-09-19 LAB — POCT URINE PREGNANCY: Preg Test, Ur: NEGATIVE

## 2017-09-19 NOTE — Progress Notes (Signed)
Date:  09/19/2017   Insured   [x]    Uninsured  []     HPI  Christine Carlson is a 42 y.o. adult who is here for her routine 3 mo PrEP visit.   Demographics Race:  Black or African American [2] Marital Status:  Married  Allergies Allergies  Allergen Reactions  . Cephalexin Rash    Rash on tongue and back of mouth    Past Medical History:  Diagnosis Date  . Diabetes mellitus without complication (HCC)   . Hypertension     Outpatient Encounter Medications as of 09/19/2017  Medication Sig  . atorvastatin (LIPITOR) 20 MG tablet Take 20 mg by mouth daily.  . clonazePAM (KLONOPIN) 0.5 MG tablet Take 0.5 mg by mouth 2 (two) times daily as needed for anxiety.  . empagliflozin (JARDIANCE) 10 MG TABS tablet Take 10 mg by mouth daily.  Marland Kitchen emtricitabine-tenofovir (TRUVADA) 200-300 MG tablet Take 1 tablet by mouth daily.  . fexofenadine (ALLEGRA) 180 MG tablet Take 180 mg by mouth daily.  Marland Kitchen losartan-hydrochlorothiazide (HYZAAR) 100-25 MG tablet Take 1 tablet by mouth daily.  . metoprolol succinate (TOPROL-XL) 50 MG 24 hr tablet Take 50 mg by mouth daily. Take with or immediately following a meal.  . mometasone (NASONEX) 50 MCG/ACT nasal spray Place 2 sprays into the nose daily.  . Multiple Vitamin (MULTIVITAMIN) tablet Take 1 tablet by mouth daily.  . sitaGLIPtin-metformin (JANUMET) 50-1000 MG per tablet Take 1 tablet by mouth 2 (two) times daily with a meal.  . valACYclovir (VALTREX) 500 MG tablet Take 500 mg by mouth daily.   . [DISCONTINUED] canagliflozin (INVOKANA) 100 MG TABS tablet Take 100 mg by mouth daily.  . [DISCONTINUED] montelukast (SINGULAIR) 10 MG tablet Take 10 mg by mouth at bedtime.   No facility-administered encounter medications on file as of 09/19/2017.     Social History   Tobacco Use  Smoking Status Never Smoker  Smokeless Tobacco Never Used   Social History   Substance and Sexual Activity  Alcohol Use No  . Alcohol/week: 0.0 standard drinks     Drug use?   Yes []  No [x]   Injectable drug use?   Yes []     No [x]   Sexual History  Missing doses? Yes []    No  [x]   CHL HIV PREP FLOWSHEET RESULTS 09/19/2017 06/20/2017 03/28/2017  Insurance Status Insured Insured Insured  How did you hear? Husband - Husband is a patient here  Gender at birth Female Female Female  Gender identity cis-Female cis-Female cis-Female  Risk for HIV In sexual relationship with HIV+ partner;Condomless vaginal or anal intercourse;Hx of STI In sexual relationship with HIV+ partner;Condomless vaginal or anal intercourse In sexual relationship with HIV+ partner;Condomless vaginal or anal intercourse  Sex Partners Men only Men only Men only  # sex partners past 3-6 mos 1-3 1-3 1-3  Sex activity preferences Receptive;Oral Receptive Receptive  Condom use No No No  Partners genders and ages 2 69-49 - M 30-49  Treated for STI? No No No  HIV symptoms? N/A N/A N/A  PrEP Eligibility HIV negative;CrCl >60 ml/min;Substantial risk for HIV Substantial risk for HIV HIV negative;Substantial risk for HIV  Preg status No No No  Breastfeeding? No No No  Paper work received? No - Yes    Labs: Creatinine Lab Results  Component Value Date   CREATININE 0.80 03/28/2017   CREATININE 0.62 12/13/2016   CREATININE 0.61 06/28/2016   CREATININE 0.72 01/17/2016   CREATININE 0.63 07/12/2015  HIV Lab Results  Component Value Date   HIV NON-REACTIVE 06/20/2017   HIV NON-REACTIVE 03/28/2017   HIV NON-REACTIVE 12/13/2016   HIV NONREACTIVE 06/28/2016   HIV NONREACTIVE 01/17/2016    GFR CrCl cannot be calculated (Patient's most recent lab result is older than the maximum 21 days allowed.).  Hepatitis B Lab Results  Component Value Date   HEPBSAB POS (A) 12/02/2014   Lab Results  Component Value Date   HEPBSAG NEGATIVE 12/02/2014    Hepatitis C No results found for: HCVAB  Hepatitis A Lab Results  Component Value Date   HAV NON REACTIVE 12/02/2014    RPR  and STI Lab Results  Component Value Date   LABRPR NON-REACTIVE 03/28/2017   LABRPR REACTIVE (A) 12/13/2016   LABRPR NON REAC 06/28/2016   LABRPR NON REAC 07/12/2015   LABRPR Non Reactive 10/15/2014   RPRTITER 1:1 (H) 12/13/2016    STI Results GC CT  12/13/2016 Negative Negative  06/28/2016 Negative Negative  01/17/2016 Negative Negative  10/15/2014 Negative Negative     Assessment  Christine Carlson was supposed to come next week for her 3 mo PrEP visit but she walked in today because she forgot. We will see her today. She is a very low risk patient. Her husband is suppressed on Genvoya. They do not use any condoms. She has not had any symptoms of HIV or STDs. We will get the routine 6-mo STDs with urine only along with her HIV and Bmet. She has not missed any doses but did take several doses late. She denied any side effects.   She is on her husband insurance now. [facility], therefore, she should be able to get it there from now on.    Recommendations   HIV, STDs, bmet today Three months of TRV if neg F/u in 3 mo  Ulyses SouthwardMinh Kiira Brach, PharmD, [place]IDP, [place], CPP Infectious Disease Pharmacist Pager: [phone] 09/19/2017 10:22 AM

## 2017-09-20 ENCOUNTER — Telehealth: Payer: Self-pay | Admitting: Pharmacist Clinician (PhC)/ Clinical Pharmacy Specialist

## 2017-09-20 DIAGNOSIS — Z79899 Other long term (current) drug therapy: Secondary | ICD-10-CM

## 2017-09-20 LAB — RPR: RPR Ser Ql: NONREACTIVE

## 2017-09-20 LAB — BASIC METABOLIC PANEL
BUN: 13 mg/dL (ref 7–25)
CHLORIDE: 103 mmol/L (ref 98–110)
CO2: 28 mmol/L (ref 20–32)
CREATININE: 0.7 mg/dL (ref 0.50–1.10)
Calcium: 9.4 mg/dL (ref 8.6–10.2)
Glucose, Bld: 173 mg/dL — ABNORMAL HIGH (ref 65–99)
POTASSIUM: 3.9 mmol/L (ref 3.5–5.3)
SODIUM: 136 mmol/L (ref 135–146)

## 2017-09-20 LAB — URINE CYTOLOGY ANCILLARY ONLY
Chlamydia: NEGATIVE
Neisseria Gonorrhea: NEGATIVE

## 2017-09-20 LAB — HIV ANTIBODY (ROUTINE TESTING W REFLEX): HIV 1&2 Ab, 4th Generation: NONREACTIVE

## 2017-09-20 MED ORDER — EMTRICITABINE-TENOFOVIR DF 200-300 MG PO TABS: 1.0000 | ORAL_TABLET | Freq: Every day | ORAL | 2 refills | Status: DC

## 2017-09-20 NOTE — Telephone Encounter (Signed)
HIV ab neg so 3 mo of TRV. Left a generic VM for [facility].

## 2017-09-26 ENCOUNTER — Ambulatory Visit: Payer: BC Managed Care – PPO

## 2017-09-26 MED FILL — TRUVADA 200-300 MG TABS: 200-300 | 30 days supply | Qty: 30 | Fill #0

## 2017-10-28 MED FILL — TRUVADA 200-300 MG TABS: 200-300 | 30 days supply | Qty: 30 | Fill #1

## 2017-11-20 ENCOUNTER — Ambulatory Visit (INDEPENDENT_AMBULATORY_CARE_PROVIDER_SITE_OTHER): Payer: BLUE CROSS/BLUE SHIELD | Admitting: Behavioral Health

## 2017-11-20 ENCOUNTER — Other Ambulatory Visit: Payer: BC Managed Care – PPO

## 2017-11-20 DIAGNOSIS — Z23 Encounter for immunization: Secondary | ICD-10-CM | POA: Diagnosis not present

## 2017-11-28 MED FILL — TRUVADA 200-300 MG TABS: 200-300 | 30 days supply | Qty: 30 | Fill #2

## 2017-12-17 ENCOUNTER — Ambulatory Visit (INDEPENDENT_AMBULATORY_CARE_PROVIDER_SITE_OTHER): Payer: BLUE CROSS/BLUE SHIELD | Admitting: Pharmacist

## 2017-12-17 DIAGNOSIS — Z7251 High risk heterosexual behavior: Secondary | ICD-10-CM

## 2017-12-17 LAB — POCT URINE PREGNANCY: Preg Test, Ur: NEGATIVE

## 2017-12-17 NOTE — Progress Notes (Signed)
Date:  12/17/2017   HPI: Christine Carlson is a 42 y.o. adult presenting for 3 month PrEP follow up.  Insured   [x]    Uninsured  []    Patient Active Problem List   Diagnosis Date Noted  . High risk sexual behavior 01/04/2017  . H/O seasonal allergies 12/04/2014  . H/O herpes simplex infection 12/04/2014    Patient's Medications  New Prescriptions   No medications on file  Previous Medications   ATORVASTATIN (LIPITOR) 20 MG TABLET    Take 20 mg by mouth daily.   CLONAZEPAM (KLONOPIN) 0.5 MG TABLET    Take 0.5 mg by mouth 2 (two) times daily as needed for anxiety.   EMPAGLIFLOZIN (JARDIANCE) 10 MG TABS TABLET    Take 10 mg by mouth daily.   EMTRICITABINE-TENOFOVIR (TRUVADA) 200-300 MG TABLET    Take 1 tablet by mouth daily.   FEXOFENADINE (ALLEGRA) 180 MG TABLET    Take 180 mg by mouth daily.   LOSARTAN-HYDROCHLOROTHIAZIDE (HYZAAR) 100-25 MG TABLET    Take 1 tablet by mouth daily.   METOPROLOL SUCCINATE (TOPROL-XL) 50 MG 24 HR TABLET    Take 50 mg by mouth daily. Take with or immediately following a meal.   MOMETASONE (NASONEX) 50 MCG/ACT NASAL SPRAY    Place 2 sprays into the nose daily.   MULTIPLE VITAMIN (MULTIVITAMIN) TABLET    Take 1 tablet by mouth daily.   SITAGLIPTIN-METFORMIN (JANUMET) 50-1000 MG PER TABLET    Take 1 tablet by mouth 2 (two) times daily with a meal.   VALACYCLOVIR (VALTREX) 500 MG TABLET    Take 500 mg by mouth daily.   Modified Medications   No medications on file  Discontinued Medications   No medications on file    Allergies: Allergies  Allergen Reactions  . Cephalexin Rash    Rash on tongue and back of mouth    Past Medical History: Past Medical History:  Diagnosis Date  . Diabetes mellitus without complication (HCC)   . Hypertension     Social History: Social History   Socioeconomic History  . Marital status: Married    Spouse name: Not on file  . Number of children: Not on file  . Years of education: Not on file  .  Highest education level: Not on file  Occupational History  . Not on file  Social Needs  . Financial resource strain: Not on file  . Food insecurity:    Worry: Not on file    Inability: Not on file  . Transportation needs:    Medical: Not on file    Non-medical: Not on file  Tobacco Use  . Smoking status: Never Smoker  . Smokeless tobacco: Never Used  Substance and Sexual Activity  . Alcohol use: No    Alcohol/week: 0.0 standard drinks  . Drug use: No  . Sexual activity: Not on file  Lifestyle  . Physical activity:    Days per week: Not on file    Minutes per session: Not on file  . Stress: Not on file  Relationships  . Social connections:    Talks on phone: Not on file    Gets together: Not on file    Attends religious service: Not on file    Active member of club or organization: Not on file    Attends meetings of clubs or organizations: Not on file    Relationship status: Not on file  Other Topics Concern  . Not on file  Social  History Narrative  . Not on file    CHL HIV PREP FLOWSHEET RESULTS 12/17/2017 09/19/2017 06/20/2017 03/28/2017  Insurance Status Insured Insured Insured Insured  How did you hear? - Husband - Husband is a patient here  Gender at birth Female Female Female Female  Gender identity cis-Female cis-Female cis-Female cis-Female  Risk for HIV In sexual relationship with HIV+ partner;Condomless vaginal or anal intercourse In sexual relationship with HIV+ partner;Condomless vaginal or anal intercourse;Hx of STI In sexual relationship with HIV+ partner;Condomless vaginal or anal intercourse In sexual relationship with HIV+ partner;Condomless vaginal or anal intercourse  Sex Partners Men only Men only Men only Men only  # sex partners past 3-6 mos 1-3 1-3 1-3 1-3  Sex activity preferences Receptive;Oral Receptive;Oral Receptive Receptive  Condom use No No No No  Partners genders and ages - 61M 530-49 - M 30-49  Treated for STI? No No No No  HIV symptoms?  N/A N/A N/A N/A  PrEP Eligibility HIV negative;CrCl >60 ml/min;Substantial risk for HIV HIV negative;CrCl >60 ml/min;Substantial risk for HIV Substantial risk for HIV HIV negative;Substantial risk for HIV  Preg status No No No No  Breastfeeding? No No No No  Paper work received? - No - Yes    Labs:  SCr: Lab Results  Component Value Date   CREATININE 0.70 09/19/2017   CREATININE 0.80 03/28/2017   CREATININE 0.62 12/13/2016   CREATININE 0.61 06/28/2016   CREATININE 0.72 01/17/2016   HIV Lab Results  Component Value Date   HIV NON-REACTIVE 09/19/2017   HIV NON-REACTIVE 06/20/2017   HIV NON-REACTIVE 03/28/2017   HIV NON-REACTIVE 12/13/2016   HIV NONREACTIVE 06/28/2016   Hepatitis B Lab Results  Component Value Date   HEPBSAB POS (A) 12/02/2014   HEPBSAG NEGATIVE 12/02/2014   Hepatitis C No results found for: HEPCAB, HCVRNAPCRQN Hepatitis A Lab Results  Component Value Date   HAV NON REACTIVE 12/02/2014   RPR and STI Lab Results  Component Value Date   LABRPR NON-REACTIVE 09/19/2017   LABRPR NON-REACTIVE 03/28/2017   LABRPR REACTIVE (A) 12/13/2016   LABRPR NON REAC 06/28/2016   LABRPR NON REAC 07/12/2015   RPRTITER 1:1 (H) 12/13/2016    STI Results GC CT  09/19/2017 Negative Negative  12/13/2016 Negative Negative  06/28/2016 Negative Negative  01/17/2016 Negative Negative  10/15/2014 Negative Negative    Assessment: Christine Carlson is here for 3 month PrEP follow up. Her husband is HIV+ and suppressed on ART. Christine Carlson continues to do well on Truvada with no side effects and [facility] She has missed only 1 dose about a month ago. She has no s/sx of acute HIV infection.   Plan: HIV Ab If negative, 3 months of Truvada Pregnancy test Next f/u appointment on 2/17 at 1000   N. Zigmund Danieleja, PharmD PGY2 Infectious Diseases Pharmacy Resident Phone: [phone] 12/17/2017, 10:14 AM

## 2017-12-18 ENCOUNTER — Telehealth: Payer: Self-pay | Admitting: Pharmacist

## 2017-12-18 ENCOUNTER — Other Ambulatory Visit: Payer: Self-pay | Admitting: Pharmacist

## 2017-12-18 DIAGNOSIS — Z79899 Other long term (current) drug therapy: Secondary | ICD-10-CM

## 2017-12-18 LAB — HIV ANTIBODY (ROUTINE TESTING W REFLEX): HIV 1&2 Ab, 4th Generation: NONREACTIVE

## 2017-12-18 MED ORDER — EMTRICITABINE-TENOFOVIR DF 200-300 MG PO TABS: 1.0000 | ORAL_TABLET | Freq: Every day | ORAL | 2 refills | Status: DC

## 2017-12-18 NOTE — Telephone Encounter (Signed)
Called [name] MossesDiana to let her know her HIV test is negative. Will send in a prescription for 3 months of Truvada.

## 2017-12-20 MED FILL — TRUVADA 200-300 MG TABS: 200-300 | 30 days supply | Qty: 30 | Fill #0

## 2018-01-17 MED FILL — TRUVADA 200-300 MG TABS: 200-300 | 30 days supply | Qty: 30 | Fill #1

## 2018-03-17 ENCOUNTER — Ambulatory Visit: Payer: BC Managed Care – PPO | Admitting: Pharmacist

## 2018-03-19 ENCOUNTER — Ambulatory Visit (INDEPENDENT_AMBULATORY_CARE_PROVIDER_SITE_OTHER): Payer: BLUE CROSS/BLUE SHIELD | Admitting: Pharmacist

## 2018-03-19 DIAGNOSIS — Z7251 High risk heterosexual behavior: Secondary | ICD-10-CM

## 2018-03-19 NOTE — Progress Notes (Signed)
Date:  03/19/2018   HPI: Christine Carlson is a 43 y.o. adult who presents to the [facility] pharmacy clinic for 3 month PrEP follow-up.  Insured   [x]    Uninsured  []    Patient Active Problem List   Diagnosis Date Noted  . High risk sexual behavior 01/04/2017  . H/O seasonal allergies 12/04/2014  . H/O herpes simplex infection 12/04/2014    Patient's Medications  New Prescriptions   No medications on file  Previous Medications   ATORVASTATIN (LIPITOR) 20 MG TABLET    Take 20 mg by mouth daily.   CLONAZEPAM (KLONOPIN) 0.5 MG TABLET    Take 0.5 mg by mouth 2 (two) times daily as needed for anxiety.   EMPAGLIFLOZIN (JARDIANCE) 10 MG TABS TABLET    Take 10 mg by mouth daily.   EMTRICITABINE-TENOFOVIR (TRUVADA) 200-300 MG TABLET    Take 1 tablet by mouth daily.   FEXOFENADINE (ALLEGRA) 180 MG TABLET    Take 180 mg by mouth daily.   LOSARTAN-HYDROCHLOROTHIAZIDE (HYZAAR) 100-25 MG TABLET    Take 1 tablet by mouth daily.   METOPROLOL SUCCINATE (TOPROL-XL) 50 MG 24 HR TABLET    Take 50 mg by mouth daily. Take with or immediately following a meal.   MOMETASONE (NASONEX) 50 MCG/ACT NASAL SPRAY    Place 2 sprays into the nose daily.   MULTIPLE VITAMIN (MULTIVITAMIN) TABLET    Take 1 tablet by mouth daily.   SITAGLIPTIN-METFORMIN (JANUMET) 50-1000 MG PER TABLET    Take 1 tablet by mouth 2 (two) times daily with a meal.   VALACYCLOVIR (VALTREX) 500 MG TABLET    Take 500 mg by mouth daily.   Modified Medications   No medications on file  Discontinued Medications   No medications on file    Allergies: Allergies  Allergen Reactions  . Cephalexin Rash    Rash on tongue and back of mouth    Past Medical History: Past Medical History:  Diagnosis Date  . Diabetes mellitus without complication (HCC)   . Hypertension     Social History: Social History   Socioeconomic History  . Marital status: Married    Spouse name: Not on file  . Number of children: Not on file  . Years of  education: Not on file  . Highest education level: Not on file  Occupational History  . Not on file  Social Needs  . Financial resource strain: Not on file  . Food insecurity:    Worry: Not on file    Inability: Not on file  . Transportation needs:    Medical: Not on file    Non-medical: Not on file  Tobacco Use  . Smoking status: Never Smoker  . Smokeless tobacco: Never Used  Substance and Sexual Activity  . Alcohol use: No    Alcohol/week: 0.0 standard drinks  . Drug use: No  . Sexual activity: Not on file  Lifestyle  . Physical activity:    Days per week: Not on file    Minutes per session: Not on file  . Stress: Not on file  Relationships  . Social connections:    Talks on phone: Not on file    Gets together: Not on file    Attends religious service: Not on file    Active member of club or organization: Not on file    Attends meetings of clubs or organizations: Not on file    Relationship status: Not on file  Other Topics Concern  .  Not on file  Social History Narrative  . Not on file    [facility] HIV PREP FLOWSHEET RESULTS 03/19/2018 12/17/2017 09/19/2017 06/20/2017 03/28/2017  Insurance Status Insured Insured Insured Insured Insured  How did you hear? - - Husband - Husband is a patient here  Gender at birth Female Female Female Female Female  Gender identity cis-Female cis-Female cis-Female cis-Female cis-Female  Risk for HIV In sexual relationship with HIV+ partner;Condomless vaginal or anal intercourse In sexual relationship with HIV+ partner;Condomless vaginal or anal intercourse In sexual relationship with HIV+ partner;Condomless vaginal or anal intercourse;Hx of STI In sexual relationship with HIV+ partner;Condomless vaginal or anal intercourse In sexual relationship with HIV+ partner;Condomless vaginal or anal intercourse  Sex Partners Men only Men only Men only Men only Men only  # sex partners past 3-6 mos 1-3 1-3 1-3 1-3 1-3  Sex activity preferences Receptive  Receptive;Oral Receptive;Oral Oceanographer  Condom use No No No No No  Partners genders and ages - - M 84-49 - M 30-49  Treated for STI? No No No No No  HIV symptoms? N/A N/A N/A N/A N/A  PrEP Eligibility Substantial risk for HIV HIV negative;CrCl >60 ml/min;Substantial risk for HIV HIV negative;CrCl >60 ml/min;Substantial risk for HIV Substantial risk for HIV HIV negative;Substantial risk for HIV  Preg status No No No No No  Breastfeeding? Yes No No No No  Paper work received? - - No - Yes    Labs:  SCr: Lab Results  Component Value Date   CREATININE 0.70 09/19/2017   CREATININE 0.80 03/28/2017   CREATININE 0.62 12/13/2016   CREATININE 0.61 06/28/2016   CREATININE 0.72 01/17/2016   HIV Lab Results  Component Value Date   HIV NON-REACTIVE 12/17/2017   HIV NON-REACTIVE 09/19/2017   HIV NON-REACTIVE 06/20/2017   HIV NON-REACTIVE 03/28/2017   HIV NON-REACTIVE 12/13/2016   Hepatitis B Lab Results  Component Value Date   HEPBSAB POS (A) 12/02/2014   HEPBSAG NEGATIVE 12/02/2014   Hepatitis C No results found for: HEPCAB, HCVRNAPCRQN Hepatitis A Lab Results  Component Value Date   HAV NON REACTIVE 12/02/2014   RPR and STI Lab Results  Component Value Date   LABRPR NON-REACTIVE 09/19/2017   LABRPR NON-REACTIVE 03/28/2017   LABRPR REACTIVE (A) 12/13/2016   LABRPR NON REAC 06/28/2016   LABRPR NON REAC 07/12/2015   RPRTITER 1:1 (H) 12/13/2016    STI Results GC CT  09/19/2017 Negative Negative  12/13/2016 Negative Negative  06/28/2016 Negative Negative  01/17/2016 Negative Negative  10/15/2014 Negative Negative    Assessment: Christine Carlson is here today for her 3 month PrEP follow-up appointment and lab work.  She remains on Truvada with no issues or side effects.  She takes it every day with no missed doses.  Her husband remains on Genvoya and is doing well with an undetectable viral load.  No issues or concerns today.  They are moving to [place], [place] soon but wish to  continue getting care from our clinic.  She would like for me to send refills to [facility] in [place] on [address].  Plan: - HIV antibody and BMET today - Truvada x 3 months if HIV negative - F/u with me 5/27 at 915am  Cassie L. Kuppelweiser, PharmD, BCIDP, AAHIVP, CPP Infectious Diseases Clinical Pharmacist [facility] 03/19/2018, 11:19 AM

## 2018-03-20 ENCOUNTER — Telehealth: Payer: Self-pay | Admitting: Pharmacist

## 2018-03-20 DIAGNOSIS — Z79899 Other long term (current) drug therapy: Secondary | ICD-10-CM

## 2018-03-20 LAB — BASIC METABOLIC PANEL
BUN: 12 mg/dL (ref 7–25)
CALCIUM: 9.1 mg/dL (ref 8.6–10.2)
CO2: 27 mmol/L (ref 20–32)
CREATININE: 0.71 mg/dL (ref 0.50–1.10)
Chloride: 104 mmol/L (ref 98–110)
GLUCOSE: 151 mg/dL — AB (ref 65–99)
Potassium: 3.9 mmol/L (ref 3.5–5.3)
Sodium: 138 mmol/L (ref 135–146)

## 2018-03-20 LAB — HIV ANTIBODY (ROUTINE TESTING W REFLEX): HIV 1&2 Ab, 4th Generation: NONREACTIVE

## 2018-03-20 MED ORDER — EMTRICITABINE-TENOFOVIR DF 200-300 MG PO TABS: 1.0000 | ORAL_TABLET | Freq: Every day | ORAL | 2 refills | Status: DC

## 2018-03-20 NOTE — Telephone Encounter (Signed)
Called patient to let her know that her HIV antibody was negative.  Will send in 3 more months of Truvada.  

## 2018-04-15 ENCOUNTER — Other Ambulatory Visit: Payer: Self-pay | Admitting: Pharmacist

## 2018-04-15 DIAGNOSIS — Z7251 High risk heterosexual behavior: Secondary | ICD-10-CM

## 2018-04-15 DIAGNOSIS — Z79899 Other long term (current) drug therapy: Secondary | ICD-10-CM

## 2018-04-15 MED ORDER — EMTRICITABINE-TENOFOVIR DF 200-300 MG PO TABS: 1.0000 | ORAL_TABLET | Freq: Every day | ORAL | 2 refills | Status: DC

## 2018-04-15 MED FILL — TRUVADA 200-300 MG TABS: 200-300 | 30 days supply | Qty: 30 | Fill #0

## 2018-05-14 MED FILL — TRUVADA 200-300 MG TABS: 200-300 | 30 days supply | Qty: 30 | Fill #1

## 2018-06-12 MED FILL — TRUVADA 200-300 MG TABS: 200-300 | 30 days supply | Qty: 30 | Fill #2

## 2018-06-24 ENCOUNTER — Telehealth: Payer: Self-pay | Admitting: Pharmacist

## 2018-06-24 NOTE — Telephone Encounter (Signed)
COVID-19 Pre-Screening Questions: ° °Do you currently have a fever (>100 °F), chills or unexplained body aches?no  ° °Are you currently experiencing new cough, shortness of breath, sore throat, runny nose? No  °•  °Have you recently travelled outside the state of [place] in the last 14 days?no   °•  °Have you been in contact with someone that is currently pending confirmation of Covid19 testing or has been confirmed to have the Covid19 virus?  No  °

## 2018-06-25 ENCOUNTER — Other Ambulatory Visit (HOSPITAL_COMMUNITY)
Admission: RE | Admit: 2018-06-25 | Discharge: 2018-06-25 | Disposition: A | Discharge status: Home / Self Care | Payer: BLUE CROSS/BLUE SHIELD | Source: Ambulatory Visit | Attending: Infectious Disease | Admitting: Infectious Disease

## 2018-06-25 ENCOUNTER — Ambulatory Visit (INDEPENDENT_AMBULATORY_CARE_PROVIDER_SITE_OTHER): Payer: BLUE CROSS/BLUE SHIELD | Admitting: Pharmacist

## 2018-06-25 ENCOUNTER — Other Ambulatory Visit: Payer: Self-pay

## 2018-06-25 DIAGNOSIS — Z23 Encounter for immunization: Secondary | ICD-10-CM | POA: Diagnosis not present

## 2018-06-25 DIAGNOSIS — Z7251 High risk heterosexual behavior: Secondary | ICD-10-CM | POA: Diagnosis not present

## 2018-06-25 LAB — POCT URINE PREGNANCY: Preg Test, Ur: NEGATIVE

## 2018-06-25 NOTE — Progress Notes (Signed)
Date:  06/27/2018   HPI: Christine Carlson is a 43 y.o. adult who presents to the [facility] clinic for 3 month PrEP follow-up.  Insured   []    Uninsured  []    Patient Active Problem List   Diagnosis Date Noted  . High risk sexual behavior 01/04/2017  . H/O seasonal allergies 12/04/2014  . H/O herpes simplex infection 12/04/2014    Patient's Medications  New Prescriptions   No medications on file  Previous Medications   ATORVASTATIN (LIPITOR) 20 MG TABLET    Take 20 mg by mouth daily.   BUPROPION (WELLBUTRIN) 75 MG TABLET    Take 75 mg by mouth 2 (two) times daily.   CLONAZEPAM (KLONOPIN) 0.5 MG TABLET    Take 0.5 mg by mouth 2 (two) times daily as needed for anxiety.   EMPAGLIFLOZIN (JARDIANCE) 10 MG TABS TABLET    Take 10 mg by mouth daily.   FEXOFENADINE (ALLEGRA) 180 MG TABLET    Take 180 mg by mouth daily.   LOSARTAN-HYDROCHLOROTHIAZIDE (HYZAAR) 100-25 MG TABLET    Take 1 tablet by mouth daily.   METOPROLOL SUCCINATE (TOPROL-XL) 50 MG 24 HR TABLET    Take 50 mg by mouth daily. Take with or immediately following a meal.   MOMETASONE (NASONEX) 50 MCG/ACT NASAL SPRAY    Place 2 sprays into the nose daily.   MULTIPLE VITAMIN (MULTIVITAMIN) TABLET    Take 1 tablet by mouth daily.   SERTRALINE (ZOLOFT) 100 MG TABLET    Take 100 mg by mouth daily.   SITAGLIPTIN-METFORMIN (JANUMET) 50-1000 MG PER TABLET    Take 1 tablet by mouth 2 (two) times daily with a meal.   VALACYCLOVIR (VALTREX) 500 MG TABLET    Take 500 mg by mouth daily.   Modified Medications   Modified Medication Previous Medication   EMTRICITABINE-TENOFOVIR (TRUVADA) 200-300 MG TABLET emtricitabine-tenofovir (TRUVADA) 200-300 MG tablet      Take 1 tablet by mouth daily.    Take 1 tablet by mouth daily.  Discontinued Medications   No medications on file    Allergies: Allergies  Allergen Reactions  . Cephalexin Rash    Rash on tongue and back of mouth    Past Medical History: Past Medical History:   Diagnosis Date  . Diabetes mellitus without complication (HCC)   . Hypertension     Social History: Social History   Socioeconomic History  . Marital status: Married    Spouse name: Not on file  . Number of children: Not on file  . Years of education: Not on file  . Highest education level: Not on file  Occupational History  . Not on file  Social Needs  . Financial resource strain: Not on file  . Food insecurity:    Worry: Not on file    Inability: Not on file  . Transportation needs:    Medical: Not on file    Non-medical: Not on file  Tobacco Use  . Smoking status: Never Smoker  . Smokeless tobacco: Never Used  Substance and Sexual Activity  . Alcohol use: No    Alcohol/week: 0.0 standard drinks  . Drug use: No  . Sexual activity: Not on file  Lifestyle  . Physical activity:    Days per week: Not on file    Minutes per session: Not on file  . Stress: Not on file  Relationships  . Social connections:    Talks on phone: Not on file    Gets  together: Not on file    Attends religious service: Not on file    Active member of club or organization: Not on file    Attends meetings of clubs or organizations: Not on file    Relationship status: Not on file  Other Topics Concern  . Not on file  Social History Narrative  . Not on file    [facility]2018 12/17/2017 09/19/2017 06/20/2017 03/28/2017  Insurance Status Insured Insured Insured Insured Insured  How did you hear? - - Husband - Husband is a patient here  Gender at birth Female Female Female Female Female  Gender identity cis-Female cis-Female cis-Female cis-Female cis-Female  Risk for HIV In sexual relationship with HIV+ partner;Condomless vaginal or anal intercourse In sexual relationship with HIV+ partner;Condomless vaginal or anal intercourse In sexual relationship with HIV+ partner;Condomless vaginal or anal intercourse;Hx of STI In sexual relationship with HIV+ partner;Condomless  vaginal or anal intercourse In sexual relationship with HIV+ partner;Condomless vaginal or anal intercourse  Sex Partners Men only Men only Men only Men only Men only  # sex partners past 3-6 mos 1-3 1-3 1-3 1-3 1-3  Sex activity preferences Receptive Receptive;Oral Receptive;Oral Oceanographereceptive Receptive  Condom use No No No No No  Partners genders and ages - - M 6130-49 - M 30-49  Treated for STI? No No No No No  HIV symptoms? N/A N/A N/A N/A N/A  PrEP Eligibility Substantial risk for HIV HIV negative;CrCl >60 ml/min;Substantial risk for HIV HIV negative;CrCl >60 ml/min;Substantial risk for HIV Substantial risk for HIV HIV negative;Substantial risk for HIV  Preg status No No No No No  Breastfeeding? Yes No No No No  Paper work received? - - No - Yes    Labs:  SCr: Lab Results  Component Value Date   CREATININE 0.71 03/19/2018   CREATININE 0.70 09/19/2017   CREATININE 0.80 03/28/2017   CREATININE 0.62 12/13/2016   CREATININE 0.61 06/28/2016   HIV Lab Results  Component Value Date   HIV NON-REACTIVE 06/25/2018   HIV NON-REACTIVE 03/19/2018   HIV NON-REACTIVE 12/17/2017   HIV NON-REACTIVE 09/19/2017   HIV NON-REACTIVE 06/20/2017   Hepatitis B Lab Results  Component Value Date   HEPBSAB POS (A) 12/02/2014   HEPBSAG NEGATIVE 12/02/2014   Hepatitis C No results found for: HEPCAB, HCVRNAPCRQN Hepatitis A Lab Results  Component Value Date   HAV NON REACTIVE 12/02/2014   RPR and STI Lab Results  Component Value Date   LABRPR NON-REACTIVE 06/25/2018   LABRPR NON-REACTIVE 09/19/2017   LABRPR NON-REACTIVE 03/28/2017   LABRPR REACTIVE (A) 12/13/2016   LABRPR NON REAC 06/28/2016   RPRTITER 1:1 (H) 12/13/2016    STI Results GC CT  06/25/2018 Negative Negative  09/19/2017 Negative Negative  12/13/2016 Negative Negative  06/28/2016 Negative Negative  01/17/2016 Negative Negative  10/15/2014 Negative Negative    Assessment: Christine Carlson is here today for her 3 month PrEP follow-up  and lab appointment.  She remains on Truvada with no issues and no side effects.  No missed doses in the last 3 months.  She has recently started a few new medications which were added to her profile.  Her husband is undetectable on Genvoya.  No issues today.  Will check labs and see her back in 3 months.  [facility]  I noticed that she never had her 2nd and final Hep A vaccine, so I will administer that today and check for immunity at her  next visit.  Plan: - HIV antibody, RPR, urine cytology, urine preg today - Truvada x 3 months if HIV negative - F/u with me again 9/1 at 1130  Taquila Leys L. Satya Bohall, PharmD, BCIDP, AAHIVP, CPP Infectious Diseases Clinical Pharmacist [facility] 06/27/2018, 11:48 AM

## 2018-06-26 LAB — URINE CYTOLOGY ANCILLARY ONLY
Chlamydia: NEGATIVE
Neisseria Gonorrhea: NEGATIVE

## 2018-06-26 LAB — HIV ANTIBODY (ROUTINE TESTING W REFLEX): HIV 1&2 Ab, 4th Generation: NONREACTIVE

## 2018-06-26 LAB — RPR: RPR Ser Ql: NONREACTIVE

## 2018-06-27 ENCOUNTER — Telehealth: Payer: Self-pay | Admitting: Pharmacist

## 2018-06-27 DIAGNOSIS — Z7251 High risk heterosexual behavior: Secondary | ICD-10-CM

## 2018-06-27 MED ORDER — EMTRICITABINE-TENOFOVIR DF 200-300 MG PO TABS: 1.0000 | ORAL_TABLET | Freq: Every day | ORAL | 2 refills | Status: DC

## 2018-06-27 NOTE — Telephone Encounter (Signed)
Called patient to let her know that her HIV antibody was negative.  Will send in 3 more months of Truvada.  

## 2018-07-14 MED FILL — TRUVADA 200-300 MG TABS: 200-300 | 30 days supply | Qty: 30 | Fill #0

## 2018-08-13 MED FILL — TRUVADA 200-300 MG TABS: 200-300 | 30 days supply | Qty: 30 | Fill #1

## 2018-09-15 MED FILL — TRUVADA 200-300 MG TABS: 200-300 | 30 days supply | Qty: 30 | Fill #2

## 2018-09-30 ENCOUNTER — Ambulatory Visit (INDEPENDENT_AMBULATORY_CARE_PROVIDER_SITE_OTHER): Payer: BC Managed Care – PPO | Admitting: Pharmacist

## 2018-09-30 ENCOUNTER — Other Ambulatory Visit: Payer: Self-pay

## 2018-09-30 DIAGNOSIS — Z23 Encounter for immunization: Secondary | ICD-10-CM | POA: Diagnosis not present

## 2018-09-30 DIAGNOSIS — Z7251 High risk heterosexual behavior: Secondary | ICD-10-CM

## 2018-09-30 NOTE — Progress Notes (Signed)
Date:  09/30/2018   HPI: Christine Carlson is a 43 y.o. adult who presents to the [facility] pharmacy clinic for 3 month PrEP follow-up.  Insured   [x]    Uninsured  []    Patient Active Problem List   Diagnosis Date Noted  . High risk sexual behavior 01/04/2017  . H/O seasonal allergies 12/04/2014  . H/O herpes simplex infection 12/04/2014    Patient's Medications  New Prescriptions   No medications on file  Previous Medications   ATORVASTATIN (LIPITOR) 20 MG TABLET    Take 20 mg by mouth daily.   BUPROPION (WELLBUTRIN) 75 MG TABLET    Take 75 mg by mouth 2 (two) times daily.   CLONAZEPAM (KLONOPIN) 0.5 MG TABLET    Take 0.5 mg by mouth 2 (two) times daily as needed for anxiety.   EMPAGLIFLOZIN (JARDIANCE) 10 MG TABS TABLET    Take 10 mg by mouth daily.   EMTRICITABINE-TENOFOVIR (TRUVADA) 200-300 MG TABLET    Take 1 tablet by mouth daily.   FEXOFENADINE (ALLEGRA) 180 MG TABLET    Take 180 mg by mouth daily.   LOSARTAN-HYDROCHLOROTHIAZIDE (HYZAAR) 100-25 MG TABLET    Take 1 tablet by mouth daily.   METOPROLOL SUCCINATE (TOPROL-XL) 50 MG 24 HR TABLET    Take 50 mg by mouth daily. Take with or immediately following a meal.   MOMETASONE (NASONEX) 50 MCG/ACT NASAL SPRAY    Place 2 sprays into the nose daily.   MULTIPLE VITAMIN (MULTIVITAMIN) TABLET    Take 1 tablet by mouth daily.   SERTRALINE (ZOLOFT) 100 MG TABLET    Take 100 mg by mouth daily.   SITAGLIPTIN-METFORMIN (JANUMET) 50-1000 MG PER TABLET    Take 1 tablet by mouth 2 (two) times daily with a meal.   VALACYCLOVIR (VALTREX) 500 MG TABLET    Take 500 mg by mouth daily.   Modified Medications   No medications on file  Discontinued Medications   No medications on file    Allergies: Allergies  Allergen Reactions  . Cephalexin Rash    Rash on tongue and back of mouth    Past Medical History: Past Medical History:  Diagnosis Date  . Diabetes mellitus without complication (HCC)   . Hypertension     Social History:  Social History   Socioeconomic History  . Marital status: Married    Spouse name: Not on file  . Number of children: Not on file  . Years of education: Not on file  . Highest education level: Not on file  Occupational History  . Not on file  Social Needs  . Financial resource strain: Not on file  . Food insecurity    Worry: Not on file    Inability: Not on file  . Transportation needs    Medical: Not on file    Non-medical: Not on file  Tobacco Use  . Smoking status: Never Smoker  . Smokeless tobacco: Never Used  Substance and Sexual Activity  . Alcohol use: No    Alcohol/week: 0.0 standard drinks  . Drug use: No  . Sexual activity: Not on file  Lifestyle  . Physical activity    Days per week: Not on file    Minutes per session: Not on file  . Stress: Not on file  Relationships  . Social Musician on phone: Not on file    Gets together: Not on file    Attends religious service: Not on file  Active member of club or organization: Not on file    Attends meetings of clubs or organizations: Not on file    Relationship status: Not on file  Other Topics Concern  . Not on file  Social History Narrative  . Not on file    [facility] HIV PREP FLOWSHEET RESULTS 09/30/2018 03/19/2018 12/17/2017 09/19/2017 06/20/2017 03/28/2017  Insurance Status Insured Insured Insured Insured Insured Insured  How did you hear? - - - Husband - Husband is a patient here  Gender at birth Female Female Female Female Female Female  Gender identity cis-Female cis-Female cis-Female cis-Female cis-Female cis-Female  Risk for HIV In sexual relationship with HIV+ partner In sexual relationship with HIV+ partner;Condomless vaginal or anal intercourse In sexual relationship with HIV+ partner;Condomless vaginal or anal intercourse In sexual relationship with HIV+ partner;Condomless vaginal or anal intercourse;Hx of STI In sexual relationship with HIV+ partner;Condomless vaginal or anal intercourse In sexual  relationship with HIV+ partner;Condomless vaginal or anal intercourse  Sex Partners Men only Men only Men only Men only Men only Men only  # sex partners past 3-6 mos 1-3 1-3 1-3 1-3 1-3 1-3  Sex activity preferences Receptive Receptive Receptive;Oral Receptive;Oral Building services engineer  Condom use No No No No No No  Partners genders and ages - - - M 30-49 - M 30-49  Treated for STI? No No No No No No  HIV symptoms? N/A N/A N/A N/A N/A N/A  PrEP Eligibility Substantial risk for HIV Substantial risk for HIV HIV negative;CrCl >60 ml/min;Substantial risk for HIV HIV negative;CrCl >60 ml/min;Substantial risk for HIV Substantial risk for HIV HIV negative;Substantial risk for HIV  Preg status No No No No No No  Breastfeeding? No Yes No No No No  Paper work received? - - - No - Yes    Labs:  SCr: Lab Results  Component Value Date   CREATININE 0.71 03/19/2018   CREATININE 0.70 09/19/2017   CREATININE 0.80 03/28/2017   CREATININE 0.62 12/13/2016   CREATININE 0.61 06/28/2016   HIV Lab Results  Component Value Date   HIV NON-REACTIVE 06/25/2018   HIV NON-REACTIVE 03/19/2018   HIV NON-REACTIVE 12/17/2017   HIV NON-REACTIVE 09/19/2017   HIV NON-REACTIVE 06/20/2017   Hepatitis B Lab Results  Component Value Date   HEPBSAB POS (A) 12/02/2014   HEPBSAG NEGATIVE 12/02/2014   Hepatitis C No results found for: HEPCAB, HCVRNAPCRQN Hepatitis A Lab Results  Component Value Date   HAV NON REACTIVE 12/02/2014   RPR and STI Lab Results  Component Value Date   LABRPR NON-REACTIVE 06/25/2018   LABRPR NON-REACTIVE 09/19/2017   LABRPR NON-REACTIVE 03/28/2017   LABRPR REACTIVE (A) 12/13/2016   LABRPR NON REAC 06/28/2016   RPRTITER 1:1 (H) 12/13/2016    STI Results GC CT  06/25/2018 Negative Negative  09/19/2017 Negative Negative  12/13/2016 Negative Negative  06/28/2016 Negative Negative  01/17/2016 Negative Negative  10/15/2014 Negative Negative    Assessment: Christine Carlson was seen in  clinic for her three month PrEP follow-up. She is currently taking Truvada and reports she is tolerating it well with noted adverse events. She has no missed doses and has not started any new medications. Her husband is undetectable on Genvoya. She has recently moved three hours away but would like to continue following here at [facility]. Her husband has follow ups every 3 months. We discussed with Dr. Linus Salmons and he agrees it we can extend her time in-between visits to coordinate with her husband to make it more convenient for them.  We will get labs and she will schedule her next appointment when she is here for her husbands check up in a few weeks. She was given her second and final hepatitis A vaccine last visit so we will check for immunity now. She also requested a flu shot which was given.  Plan: -Continue Truvada x3 months pending neg HIV - Ab -HIV antibody -Hepatitis A antibody -BMET   Jettie Paganyler Jigar Zielke, PharmD PGY2 Infectious Disease Pharmacy Resident  [facility] 09/30/2018, 12:21 PM

## 2018-10-01 ENCOUNTER — Other Ambulatory Visit: Payer: Self-pay | Admitting: Pharmacist

## 2018-10-01 DIAGNOSIS — Z7251 High risk heterosexual behavior: Secondary | ICD-10-CM

## 2018-10-01 LAB — BASIC METABOLIC PANEL
BUN: 14 mg/dL (ref 7–25)
CO2: 25 mmol/L (ref 20–32)
Calcium: 9.7 mg/dL (ref 8.6–10.2)
Chloride: 101 mmol/L (ref 98–110)
Creat: 0.7 mg/dL (ref 0.50–1.10)
Glucose, Bld: 117 mg/dL — ABNORMAL HIGH (ref 65–99)
Potassium: 3.8 mmol/L (ref 3.5–5.3)
Sodium: 137 mmol/L (ref 135–146)

## 2018-10-01 LAB — HEPATITIS A ANTIBODY, TOTAL: Hepatitis A AB,Total: NONREACTIVE

## 2018-10-01 LAB — HIV ANTIBODY (ROUTINE TESTING W REFLEX): HIV 1&2 Ab, 4th Generation: NONREACTIVE

## 2018-10-01 MED ORDER — TRUVADA 200-300 MG PO TABS: 1.0000 | ORAL_TABLET | Freq: Every day | ORAL | 5 refills | Status: DC

## 2018-10-01 NOTE — Progress Notes (Signed)
Patient's HIV antibody is negative.  Will send in 6 more months of Truvada to [facility].  Of note, patient will get 6 months at a time due to her driving 3 hours to appointments.  Her husband sees Dr. Linus Salmons every 6 months so will coordinate with him. Verified ok with Dr. Linus Salmons.

## 2018-10-09 MED FILL — TRUVADA 200-300 MG TABS: 200-300 | 30 days supply | Qty: 30 | Fill #0

## 2018-11-13 MED FILL — TRUVADA 200-300 MG TABS: 200-300 | 30 days supply | Qty: 30 | Fill #1

## 2018-12-18 MED FILL — TRUVADA 200-300 MG TABS: 200-300 | 30 days supply | Qty: 30 | Fill #2

## 2019-02-03 ENCOUNTER — Telehealth: Payer: Self-pay | Admitting: Pharmacy Technician

## 2019-02-03 NOTE — Telephone Encounter (Signed)
[facility] Patient Advocate Encounter  Patient's copay card had run out of funds last week, when I called, she was admitted in the hospital in [place] and would be receiving the medication inpatient. I left a voicemail today to see if she would like [place] to ship to the home address now that the copay card has restarted funds.

## 2019-02-16 ENCOUNTER — Telehealth: Payer: Self-pay | Admitting: Pharmacy Technician

## 2019-02-16 NOTE — Telephone Encounter (Addendum)
[facility] Patient Advocate Encounter   Received notification from [employer] that prior authorization for Truvada is required.   PA submitted on 02/16/2019 Status is pending  They said called stating the plan will cover generic.  The patient agreed to try generic and knows to call if there are any adverse reactions.  If she does have adverse reactions, the plan will cover the brand.  Netty Starring. Dimas Aguas CPhT Specialty Pharmacy Patient [facility] for Infectious Disease Phone: [phone] Fax:  [phone]

## 2019-02-17 MED FILL — EMTRICITABINE-TENOFOVIR DF: 200-300 | 30 days supply | Qty: 30 | Fill #3

## 2019-03-25 MED FILL — EMTRICITABINE-TENOFOVIR DF: 200-300 | 30 days supply | Qty: 30 | Fill #4

## 2019-04-28 ENCOUNTER — Telehealth: Payer: Self-pay | Admitting: Pharmacy Technician

## 2019-04-28 MED FILL — EMTRICITABINE-TENOFOVIR DF: 200-300 | 30 days supply | Qty: 30 | Fill #5

## 2019-04-28 NOTE — Telephone Encounter (Signed)
[facility] Patient Advocate [employer] company requires the patient to use generic truvada. The new NDC for generic Truvada requires a prior authorization in order to be covered. Was able to locate a coupon through [employer] to make the cost $0.   Billing information has been shared with [facility] and will be shipped out today to arrive tomorrow to the patient.

## 2019-07-17 ENCOUNTER — Other Ambulatory Visit (HOSPITAL_COMMUNITY)
Admission: RE | Admit: 2019-07-17 | Discharge: 2019-07-17 | Disposition: A | Discharge status: Home / Self Care | Payer: BC Managed Care – PPO | Source: Ambulatory Visit | Attending: Family | Admitting: Family

## 2019-07-17 ENCOUNTER — Other Ambulatory Visit: Payer: BC Managed Care – PPO

## 2019-07-17 ENCOUNTER — Other Ambulatory Visit: Payer: Self-pay | Admitting: Family

## 2019-07-17 ENCOUNTER — Other Ambulatory Visit: Payer: Self-pay

## 2019-07-17 DIAGNOSIS — Z5181 Encounter for therapeutic drug level monitoring: Secondary | ICD-10-CM | POA: Diagnosis present

## 2019-07-17 DIAGNOSIS — Z7251 High risk heterosexual behavior: Secondary | ICD-10-CM | POA: Insufficient documentation

## 2019-07-20 LAB — BASIC METABOLIC PANEL
BUN: 18 mg/dL (ref 7–25)
CO2: 29 mmol/L (ref 20–32)
Calcium: 9.4 mg/dL (ref 8.6–10.2)
Chloride: 106 mmol/L (ref 98–110)
Creat: 0.6 mg/dL (ref 0.50–1.10)
Glucose, Bld: 150 mg/dL — ABNORMAL HIGH (ref 65–99)
Potassium: 4.1 mmol/L (ref 3.5–5.3)
Sodium: 141 mmol/L (ref 135–146)

## 2019-07-20 LAB — URINE CYTOLOGY ANCILLARY ONLY
Chlamydia: NEGATIVE
Comment: NEGATIVE
Comment: NORMAL
Neisseria Gonorrhea: NEGATIVE

## 2019-07-20 LAB — RPR: RPR Ser Ql: NONREACTIVE

## 2019-07-20 LAB — HIV ANTIBODY (ROUTINE TESTING W REFLEX): HIV 1&2 Ab, 4th Generation: NONREACTIVE

## 2019-07-22 ENCOUNTER — Other Ambulatory Visit: Payer: Self-pay | Admitting: Pharmacist

## 2019-07-22 DIAGNOSIS — Z7251 High risk heterosexual behavior: Secondary | ICD-10-CM

## 2019-07-22 MED ORDER — EMTRICITABINE-TENOFOVIR DF 200-300 MG PO TABS: 1.0000 | ORAL_TABLET | Freq: Every day | ORAL | 5 refills | Status: DC

## 2019-07-22 MED FILL — EMTRICITABINE-TENOFOVIR DF: 200-300 | 30 days supply | Qty: 30 | Fill #0

## 2019-09-22 MED FILL — EMTRICITABINE-TENOFOVIR DF: 200-300 | 30 days supply | Qty: 30 | Fill #1

## 2019-10-28 MED FILL — EMTRICITABINE-TENOFOVIR DF: 200-300 | 30 days supply | Qty: 30 | Fill #2

## 2019-11-25 MED FILL — EMTRICITABINE-TENOFOVIR DF: 200-300 | 30 days supply | Qty: 30 | Fill #3

## 2019-12-21 MED FILL — EMTRICITABINE-TENOFOVIR DF: 200-300 | 30 days supply | Qty: 30 | Fill #4

## 2019-12-30 ENCOUNTER — Ambulatory Visit: Payer: BC Managed Care – PPO | Admitting: Pharmacist

## 2020-01-01 ENCOUNTER — Telehealth: Payer: Self-pay

## 2020-01-01 NOTE — Telephone Encounter (Signed)
Patient called office requesting to have labs drawn at family PCP office due to long commute time from home to our office (3 hours). Labs for her and husband sent to Dr. Marijean Bravo office, [facility] (phone [phone], fax: [phone]). Patient notified to contact our office if they don't receive them or have any issues getting them drawn at their office.   Christine Carlson Loyola Mast, RN

## 2020-01-19 MED FILL — EMTRICITABINE-TENOFOVIR DF: 200-300 | 30 days supply | Qty: 30 | Fill #5

## 2020-04-29 ENCOUNTER — Other Ambulatory Visit (HOSPITAL_COMMUNITY): Payer: Self-pay

## 2020-05-03 ENCOUNTER — Other Ambulatory Visit (HOSPITAL_COMMUNITY): Payer: Self-pay

## 2020-05-10 ENCOUNTER — Telehealth: Payer: Self-pay

## 2020-05-10 NOTE — Telephone Encounter (Signed)
-----   Message from Bobette Mo, CPhT sent at 05/10/2020  1:37 PM EDT ----- Regarding: LABS Hey Christine Carlson,  This patient called and left a message about needing lab work and wanted to schedule an appointment. If you can call her back.    Thank You,  Clearance Coots, CPhT Specialty Pharmacy Patient [facility] for Infectious Disease Phone: [phone] Fax: [phone]

## 2020-05-10 NOTE — Telephone Encounter (Signed)
I spoke with patient who is currently a PREP patient. Patient has not been seen since 09/2018. Patient states she has been taking Truvada and has not missed a dose. Patient stated she recently ran out of Truvada on 05/08/20. Patient called today requesting an appointment for a follow up. Patient scheduled for 05/11/20 with Judeth Cornfield. Patient will call in the morning if she can not make it to the appointment.  Yashica Sterbenz T Pricilla Loveless

## 2020-05-11 ENCOUNTER — Telehealth: Payer: Self-pay | Admitting: *Deleted

## 2020-05-11 ENCOUNTER — Ambulatory Visit: Payer: BC Managed Care – PPO | Admitting: Infectious Diseases

## 2020-05-11 NOTE — Telephone Encounter (Signed)
Called patient to see if she was on her way to today's visit. RN rescheduled today's missed visit. Patient will come 4/25 3:45 with Marcos Eke. Her husband will come that same day/time for labs. Andree Coss, RN

## 2020-05-12 ENCOUNTER — Other Ambulatory Visit (HOSPITAL_COMMUNITY): Payer: Self-pay

## 2020-05-23 ENCOUNTER — Encounter: Payer: Self-pay | Admitting: Family

## 2020-05-23 ENCOUNTER — Other Ambulatory Visit (HOSPITAL_COMMUNITY)
Admission: RE | Admit: 2020-05-23 | Discharge: 2020-05-23 | Disposition: A | Discharge status: Home / Self Care | Payer: BC Managed Care – PPO | Source: Ambulatory Visit | Attending: Family | Admitting: Family

## 2020-05-23 ENCOUNTER — Ambulatory Visit: Payer: BC Managed Care – PPO | Admitting: Family

## 2020-05-23 ENCOUNTER — Other Ambulatory Visit: Payer: Self-pay

## 2020-05-23 ENCOUNTER — Ambulatory Visit (INDEPENDENT_AMBULATORY_CARE_PROVIDER_SITE_OTHER): Payer: BC Managed Care – PPO | Admitting: Family

## 2020-05-23 ENCOUNTER — Other Ambulatory Visit (HOSPITAL_COMMUNITY): Payer: Self-pay

## 2020-05-23 VITALS — BP 143/83 | HR 74 | Temp 97.9°F | Wt 362.0 lb

## 2020-05-23 DIAGNOSIS — Z7251 High risk heterosexual behavior: Secondary | ICD-10-CM | POA: Diagnosis present

## 2020-05-23 MED ORDER — EMTRICITABINE-TENOFOVIR DF 200-300 MG PO TABS
1.0000 | ORAL_TABLET | Freq: Every day | ORAL | 5 refills | Status: DC
Start: 2020-05-23 — End: 2021-11-13
  Filled 2020-05-23 – 2020-05-27 (×2): qty 30, 30d supply, fill #0
  Filled 2020-06-21: qty 30, 30d supply, fill #1
  Filled 2020-08-17: qty 30, 30d supply, fill #2
  Filled 2020-09-19: qty 30, 30d supply, fill #3
  Filled 2020-10-12: qty 30, 30d supply, fill #4
  Filled 2020-11-14: qty 30, 30d supply, fill #5

## 2020-05-23 NOTE — Patient Instructions (Signed)
Nice to see you.  We will check your lab work today.  Truvada will be sent to the pharmacy.  Plan for follow up in 6 months or sooner if needed.

## 2020-05-23 NOTE — Assessment & Plan Note (Signed)
[name] takes her Truvada daily with no adverse side effects. Ran out of medication 10 days ago. Check HIV, CMET, RPR, and urine cytology today. Refills of Truvada sent to the pharmacy. Will work on finding [facility] in her home area. Plan for follow up in 6 months or sooner if needed.

## 2020-05-23 NOTE — Progress Notes (Signed)
Subjective:    Patient ID: Christine Carlson, adult    DOB: [birth year], 45 y.o.   MRN: [id number]  Chief Complaint  Patient presents with  . Follow-up    PrEP     HPI:  Christine Carlson is a 46 y.o. adult on pre-exposure prophyaxis for HIV disease with Truvada and last seen on 09/30/18 with negative HIV and and STD testing and stable renal function. Most recent lab work from 07/17/19 with negative HIV and STD testing.   Christine Carlson has been taking her Truvada daily up until 10 days ago when she ran out of medication. No adverse side effects when taking it. Medication is usually mailed and has no problems obtaining it. Overall feeling well today with no new concerns/complaints. Denies fevers, chills, night sweats, headaches, changes in vision, neck pain/stiffness, nausea, diarrhea, vomiting, lesions or rashes.    Allergies  Allergen Reactions  . Cephalexin Rash and Hives    Rash on tongue and back of mouth  . Meloxicam Hives and Rash      Outpatient Medications Prior to Visit  Medication Sig Dispense Refill  . atorvastatin (LIPITOR) 20 MG tablet Take 20 mg by mouth daily.    . cetirizine (ZYRTEC) 10 MG tablet Take by mouth.    . clonazePAM (KLONOPIN) 0.5 MG tablet Take 0.5 mg by mouth 2 (two) times daily as needed for anxiety.    . empagliflozin (JARDIANCE) 10 MG TABS tablet Take 10 mg by mouth daily.    . fluticasone (FLONASE) 50 MCG/ACT nasal spray Place into both nostrils daily.    Marland Kitchen losartan-hydrochlorothiazide (HYZAAR) 100-25 MG tablet Take 1 tablet by mouth daily.    . metoprolol succinate (TOPROL-XL) 50 MG 24 hr tablet Take 50 mg by mouth daily. Take with or immediately following a meal.    . Multiple Vitamin (MULTIVITAMIN) tablet Take 1 tablet by mouth daily.    . sertraline (ZOLOFT) 100 MG tablet Take 100 mg by mouth daily.    . sitaGLIPtin-metformin (JANUMET) 50-1000 MG per tablet Take 1 tablet by mouth 2 (two) times daily with a meal.     . valACYclovir (VALTREX) 500 MG tablet Take 500 mg by mouth daily.    Marland Kitchen buPROPion (WELLBUTRIN) 75 MG tablet Take 75 mg by mouth 2 (two) times daily. (Patient not taking: Reported on 05/23/2020)    . fexofenadine (ALLEGRA) 180 MG tablet Take 180 mg by mouth daily. (Patient not taking: Reported on 05/23/2020)    . mometasone (NASONEX) 50 MCG/ACT nasal spray Place 2 sprays into the nose daily. (Patient not taking: Reported on 05/23/2020)     No facility-administered medications prior to visit.     Past Medical History:  Diagnosis Date  . Diabetes mellitus without complication ([place])   . Hypertension      Past Surgical History:  Procedure Laterality Date  . GASTRIC BYPASS         Review of Systems  Constitutional: Negative for appetite change, chills, diaphoresis, fatigue, fever and unexpected weight change.  Eyes:       Negative for acute change in vision  Respiratory: Negative for chest tightness, shortness of breath and wheezing.   Cardiovascular: Negative for chest pain.  Gastrointestinal: Negative for diarrhea, nausea and vomiting.  Genitourinary: Negative for dysuria, pelvic pain and vaginal discharge.  Musculoskeletal: Negative for neck pain and neck stiffness.  Skin: Negative for rash.  Neurological: Negative for seizures, syncope, weakness and headaches.  Hematological: Negative for adenopathy. Does not bruise/bleed easily.  Psychiatric/Behavioral: Negative for hallucinations.      Objective:    BP (!) 143/83   Pulse 74   Temp 97.9 F (36.6 C) (Oral)   Wt (!) 362 lb (164.2 kg)   SpO2 96%   BMI 57.55 kg/m  Nursing note and vital signs reviewed.  Physical Exam Constitutional:      General: She is not in acute distress.    Appearance: She is well-developed.  Eyes:     Conjunctiva/sclera: Conjunctivae normal.  Cardiovascular:     Rate and Rhythm: Normal rate and regular rhythm.     Heart sounds: Normal heart sounds. No murmur heard. No friction rub. No  gallop.   Pulmonary:     Effort: Pulmonary effort is normal. No respiratory distress.     Breath sounds: Normal breath sounds. No wheezing or rales.  Chest:     Chest wall: No tenderness.  Abdominal:     General: Bowel sounds are normal.     Palpations: Abdomen is soft.     Tenderness: There is no abdominal tenderness.  Musculoskeletal:     Cervical back: Neck supple.  Lymphadenopathy:     Cervical: No cervical adenopathy.  Skin:    General: Skin is warm and dry.     Findings: No rash.  Neurological:     Mental Status: She is alert and oriented to person, place, and time.  Psychiatric:        Behavior: Behavior normal.        Thought Content: Thought content normal.        Judgment: Judgment normal.      Depression screen [facility] 2/9 05/23/2020 07/12/2016 01/17/2016 07/12/2015 01/10/2015  Decreased Interest 0 0 0 0 0  Down, Depressed, Hopeless 0 0 0 0 0  PHQ - 2 Score 0 0 0 0 0       Assessment & Plan:    Patient Active Problem List   Diagnosis Date Noted  . High risk sexual behavior 01/04/2017  . H/O seasonal allergies 12/04/2014  . H/O herpes simplex infection 12/04/2014     Problem List Items Addressed This Visit      Other   High risk sexual behavior - Primary    Christine Carlson takes her Truvada daily with no adverse side effects. Ran out of medication 10 days ago. Check HIV, CMET, RPR, and urine cytology today. Refills of Truvada sent to the pharmacy. Will work on finding [facility] in her home area. Plan for follow up in 6 months or sooner if needed.       Relevant Medications   emtricitabine-tenofovir (TRUVADA) 200-300 MG tablet   Other Relevant Orders   HIV antibody (with reflex)   Comp Met (CMET)   RPR   Urine cytology ancillary only([place])       I am having Christine Carlson start on emtricitabine-tenofovir. I am also having her maintain her mometasone, multivitamin, metoprolol succinate, sitaGLIPtin-metformin, valACYclovir,  clonazePAM, losartan-hydrochlorothiazide, atorvastatin, fexofenadine, empagliflozin, sertraline, buPROPion, fluticasone, and cetirizine.   Meds ordered this encounter  Medications  . emtricitabine-tenofovir (TRUVADA) 200-300 MG tablet    Sig: Take 1 tablet by mouth daily.    Dispense:  30 tablet    Refill:  5    Order Specific Question:   Supervising Provider    Answer:   Carlyle Basques [[id number]]     Follow-up: Return in about 6 months (around 11/22/2020), or if symptoms worsen or fail to improve.   Terri Piedra, MSN, FNP-C Nurse Practitioner  [facility] [place] [facility] number: [phone]

## 2020-05-24 ENCOUNTER — Other Ambulatory Visit (HOSPITAL_COMMUNITY): Payer: Self-pay

## 2020-05-24 LAB — HIV ANTIBODY (ROUTINE TESTING W REFLEX): HIV 1&2 Ab, 4th Generation: NONREACTIVE

## 2020-05-24 LAB — COMPREHENSIVE METABOLIC PANEL
AG Ratio: 1.2 (calc) (ref 1.0–2.5)
ALT: 17 U/L (ref 6–29)
AST: 15 U/L (ref 10–35)
Albumin: 4 g/dL (ref 3.6–5.1)
Alkaline phosphatase (APISO): 130 U/L — ABNORMAL HIGH (ref 31–125)
BUN: 12 mg/dL (ref 7–25)
CO2: 28 mmol/L (ref 20–32)
Calcium: 9.1 mg/dL (ref 8.6–10.2)
Chloride: 102 mmol/L (ref 98–110)
Creat: 0.59 mg/dL (ref 0.50–1.10)
Globulin: 3.3 g/dL (calc) (ref 1.9–3.7)
Glucose, Bld: 141 mg/dL — ABNORMAL HIGH (ref 65–99)
Potassium: 3.8 mmol/L (ref 3.5–5.3)
Sodium: 140 mmol/L (ref 135–146)
Total Bilirubin: 0.6 mg/dL (ref 0.2–1.2)
Total Protein: 7.3 g/dL (ref 6.1–8.1)

## 2020-05-24 LAB — RPR: RPR Ser Ql: NONREACTIVE

## 2020-05-25 LAB — URINE CYTOLOGY ANCILLARY ONLY
Chlamydia: NEGATIVE
Comment: NEGATIVE
Comment: NORMAL
Neisseria Gonorrhea: NEGATIVE

## 2020-05-26 ENCOUNTER — Other Ambulatory Visit (HOSPITAL_COMMUNITY): Payer: Self-pay

## 2020-05-26 ENCOUNTER — Telehealth: Payer: Self-pay

## 2020-05-26 NOTE — Telephone Encounter (Signed)
Patient called office for lab results and to have refill sent to pharmacy. Relayed results to patient. Informed her that refills were sent to [facility] and that she just needs to call for her rx. Patient will call with any concerns.  Juanita Laster, RMA

## 2020-05-27 ENCOUNTER — Other Ambulatory Visit (HOSPITAL_COMMUNITY): Payer: Self-pay

## 2020-05-30 ENCOUNTER — Other Ambulatory Visit (HOSPITAL_COMMUNITY): Payer: Self-pay

## 2020-06-21 ENCOUNTER — Other Ambulatory Visit (HOSPITAL_COMMUNITY): Payer: Self-pay

## 2020-06-29 ENCOUNTER — Other Ambulatory Visit (HOSPITAL_COMMUNITY): Payer: Self-pay

## 2020-07-26 ENCOUNTER — Other Ambulatory Visit (HOSPITAL_COMMUNITY): Payer: Self-pay

## 2020-08-16 ENCOUNTER — Other Ambulatory Visit (HOSPITAL_COMMUNITY): Payer: Self-pay

## 2020-08-17 ENCOUNTER — Other Ambulatory Visit (HOSPITAL_COMMUNITY): Payer: Self-pay

## 2020-08-24 ENCOUNTER — Other Ambulatory Visit (HOSPITAL_COMMUNITY): Payer: Self-pay

## 2020-09-13 ENCOUNTER — Other Ambulatory Visit (HOSPITAL_COMMUNITY): Payer: Self-pay

## 2020-09-19 ENCOUNTER — Other Ambulatory Visit (HOSPITAL_COMMUNITY): Payer: Self-pay

## 2020-10-12 ENCOUNTER — Other Ambulatory Visit (HOSPITAL_COMMUNITY): Payer: Self-pay

## 2020-10-18 ENCOUNTER — Other Ambulatory Visit (HOSPITAL_COMMUNITY): Payer: Self-pay

## 2020-11-11 ENCOUNTER — Other Ambulatory Visit (HOSPITAL_COMMUNITY): Payer: Self-pay

## 2020-11-14 ENCOUNTER — Other Ambulatory Visit (HOSPITAL_COMMUNITY): Payer: Self-pay

## 2020-11-28 ENCOUNTER — Ambulatory Visit: Payer: BC Managed Care – PPO | Admitting: Family

## 2020-12-07 ENCOUNTER — Other Ambulatory Visit: Payer: Self-pay | Admitting: Family

## 2020-12-07 ENCOUNTER — Other Ambulatory Visit (HOSPITAL_COMMUNITY): Payer: Self-pay

## 2020-12-07 DIAGNOSIS — Z7251 High risk heterosexual behavior: Secondary | ICD-10-CM

## 2021-01-06 ENCOUNTER — Other Ambulatory Visit (HOSPITAL_COMMUNITY): Payer: Self-pay

## 2021-01-17 ENCOUNTER — Other Ambulatory Visit (HOSPITAL_COMMUNITY): Payer: Self-pay

## 2021-02-10 ENCOUNTER — Other Ambulatory Visit (HOSPITAL_COMMUNITY): Payer: Self-pay

## 2021-02-13 ENCOUNTER — Other Ambulatory Visit (HOSPITAL_COMMUNITY): Payer: Self-pay

## 2021-11-13 ENCOUNTER — Other Ambulatory Visit (HOSPITAL_COMMUNITY): Payer: Self-pay

## 2021-11-13 ENCOUNTER — Telehealth: Payer: Self-pay | Admitting: Pharmacist

## 2021-11-13 DIAGNOSIS — Z206 Contact with and (suspected) exposure to human immunodeficiency virus [HIV]: Secondary | ICD-10-CM

## 2021-11-13 MED ORDER — BIKTARVY 50-200-25 MG PO TABS: 1.0000 | ORAL_TABLET | Freq: Every day | ORAL | 0 refills | Status: DC

## 2021-11-13 NOTE — Telephone Encounter (Signed)
Patient seen previously here for PrEP and was on Truvada until early last year. She called this morning with concerns after a sexual encounter on Saturday. She states that she had a new partner on Saturday evening and she is unaware of his HIV status. She had unprotected vaginal sex and he did ejaculate as well. She has no signs or symptoms of any STIs or acute HIV but would like to be started on PEP.   Explained the difference between PEP and PrEP. She is insured with [place] [employer]. Will start her on 30 days of Biktarvy for PEP. Explained that [place] Odor is a one pill once daily medication with or without food and the importance of not missing any doses. Counseled to take it around the same time each day. Counseled on what to do if dose is missed, if closer to missed dose take immediately, if closer to next dose then skip and resume normal schedule.   Cautioned on possible side effects the first week or so including nausea, diarrhea, dizziness, and headaches but that they should resolve after the first couple of weeks. I reviewed medications and found no drug interactions. Counseled to separate Biktarvy from divalent cations including multivitamins.   I would typically have her follow up soon for lab work but she lives 3 hours away. Will schedule her an appointment for when she is almost finished with her Biktarvy. Will do HIV, Hepatitis serologies, and STI testing at this visit. She will call if she has issues with obtaining Biktarvy or tolerating it.   Sotiria Keast L. Yamilett Anastos, PharmD, BCIDP, AAHIVP, CPP Clinical Pharmacist Practitioner [facility] 11/13/2021, 3:14 PM

## 2021-12-11 ENCOUNTER — Other Ambulatory Visit (HOSPITAL_COMMUNITY)
Admission: RE | Admit: 2021-12-11 | Discharge: 2021-12-11 | Disposition: A | Discharge status: Home / Self Care | Payer: Medicaid Other | Source: Ambulatory Visit | Attending: Family | Admitting: Family

## 2021-12-11 ENCOUNTER — Ambulatory Visit (INDEPENDENT_AMBULATORY_CARE_PROVIDER_SITE_OTHER): Payer: Medicaid Other | Admitting: Pharmacist

## 2021-12-11 ENCOUNTER — Other Ambulatory Visit: Payer: Self-pay

## 2021-12-11 DIAGNOSIS — Z113 Encounter for screening for infections with a predominantly sexual mode of transmission: Secondary | ICD-10-CM | POA: Insufficient documentation

## 2021-12-11 NOTE — Progress Notes (Signed)
Date:  12/11/2021   HPI: Christine Carlson is a 46 y.o. adult who presents to the [facility] for PEP follow-up.  Insured   [x]    Uninsured  []    Patient Active Problem List   Diagnosis Date Noted   High risk sexual behavior 01/04/2017   H/O seasonal allergies 12/04/2014   H/O herpes simplex infection 12/04/2014    Patient's Medications  New Prescriptions   No medications on file  Previous Medications   ATORVASTATIN (LIPITOR) 20 MG TABLET    Take 20 mg by mouth daily.   BICTEGRAVIR-EMTRICITABINE-TENOFOVIR AF (BIKTARVY) 50-200-25 MG TABS TABLET    Take 1 tablet by mouth daily.   BUPROPION (WELLBUTRIN) 75 MG TABLET    Take 75 mg by mouth 2 (two) times daily.   CETIRIZINE (ZYRTEC) 10 MG TABLET    Take by mouth.   CLONAZEPAM (KLONOPIN) 0.5 MG TABLET    Take 0.5 mg by mouth 2 (two) times daily as needed for anxiety.   EMPAGLIFLOZIN (JARDIANCE) 10 MG TABS TABLET    Take 10 mg by mouth daily.   FEXOFENADINE (ALLEGRA) 180 MG TABLET    Take 180 mg by mouth daily.   FLUTICASONE (FLONASE) 50 MCG/ACT NASAL SPRAY    Place into both nostrils daily.   LOSARTAN-HYDROCHLOROTHIAZIDE (HYZAAR) 100-25 MG TABLET    Take 1 tablet by mouth daily.   METOPROLOL SUCCINATE (TOPROL-XL) 50 MG 24 HR TABLET    Take 50 mg by mouth daily. Take with or immediately following a meal.   MOMETASONE (NASONEX) 50 MCG/ACT NASAL SPRAY    Place 2 sprays into the nose daily.   MULTIPLE VITAMIN (MULTIVITAMIN) TABLET    Take 1 tablet by mouth daily.   SERTRALINE (ZOLOFT) 100 MG TABLET    Take 100 mg by mouth daily.   SITAGLIPTIN-METFORMIN (JANUMET) 50-1000 MG PER TABLET    Take 1 tablet by mouth 2 (two) times daily with a meal.   VALACYCLOVIR (VALTREX) 500 MG TABLET    Take 500 mg by mouth daily.  Modified Medications   No medications on file  Discontinued Medications   No medications on file    Allergies: Allergies  Allergen Reactions   Cephalexin Rash and Hives    Rash on tongue and back of mouth    Meloxicam Hives and Rash    Past Medical History: Past Medical History:  Diagnosis Date   Diabetes mellitus without complication ([place])    Hypertension     Social History: Social History   Socioeconomic History   Marital status: Married    Spouse name: Not on file   Number of children: Not on file   Years of education: Not on file   Highest education level: Not on file  Occupational History   Not on file  Tobacco Use   Smoking status: Never   Smokeless tobacco: Never  Substance and Sexual Activity   Alcohol use: No    Alcohol/week: 0.0 standard drinks of alcohol   Drug use: No   Sexual activity: Not on file  Other Topics Concern   Not on file  Social History Narrative   Not on file   Social Determinants of Health   Financial Resource Strain: Not on file  Food Insecurity: Not on file  Transportation Needs: Not on file  Physical Activity: Not on file  Stress: Not on file  Social Connections: Not on file       09/30/2018   12:19 PM 03/19/2018   11:18  AM 12/17/2017   10:08 AM 09/19/2017   10:07 AM 06/20/2017    2:33 PM 03/28/2017    4:14 PM  [facility] HIV PREP FLOWSHEET RESULTS  Insurance Status Insured Insured Insured Insured Insured Insured  How did you hear?    Husband  Husband is a patient here  Gender at birth Female Female Female Female Female Female  Gender identity cis-Female cis-Female cis-Female cis-Female cis-Female cis-Female  Risk for HIV In sexual relationship with HIV+ partner In sexual relationship with HIV+ partner;Condomless vaginal or anal intercourse In sexual relationship with HIV+ partner;Condomless vaginal or anal intercourse In sexual relationship with HIV+ partner;Condomless vaginal or anal intercourse;Hx of STI In sexual relationship with HIV+ partner;Condomless vaginal or anal intercourse In sexual relationship with HIV+ partner;Condomless vaginal or anal intercourse  Sex Partners Men only Men only Men only Men only Men only Men only  # sex  partners past 3-6 mos 1-3 1-3 1-3 1-3 1-3 1-3  Sex activity preferences Receptive Receptive Receptive;Oral Receptive;Oral Building services engineer  Condom use No No No No No No  Partners genders and ages    M 30-49  M 30-49  Treated for STI? No No No No No No  HIV symptoms? N/A N/A N/A N/A N/A N/A  PrEP Eligibility Substantial risk for HIV Substantial risk for HIV HIV negative;CrCl >60 ml/min;Substantial risk for HIV HIV negative;CrCl >60 ml/min;Substantial risk for HIV Substantial risk for HIV HIV negative;Substantial risk for HIV  Preg status No No No No No No  Breastfeeding? No Yes No No No No  Paper work received?    No  Yes    Labs:  SCr: Lab Results  Component Value Date   CREATININE 0.59 05/23/2020   CREATININE 0.60 07/17/2019   CREATININE 0.70 09/30/2018   CREATININE 0.71 03/19/2018   CREATININE 0.70 09/19/2017   HIV Lab Results  Component Value Date   HIV NON-REACTIVE 05/23/2020   HIV NON-REACTIVE 07/17/2019   HIV NON-REACTIVE 09/30/2018   HIV NON-REACTIVE 06/25/2018   HIV NON-REACTIVE 03/19/2018   Hepatitis B Lab Results  Component Value Date   HEPBSAB POS (A) 12/02/2014   HEPBSAG NEGATIVE 12/02/2014   Hepatitis C No results found for: "HEPCAB", "HCVRNAPCRQN" Hepatitis A Lab Results  Component Value Date   HAV NON-REACTIVE 09/30/2018   RPR and STI Lab Results  Component Value Date   LABRPR NON-REACTIVE 05/23/2020   LABRPR NON-REACTIVE 07/17/2019   LABRPR NON-REACTIVE 06/25/2018   LABRPR NON-REACTIVE 09/19/2017   LABRPR NON-REACTIVE 03/28/2017   RPRTITER 1:1 (H) 12/13/2016    STI Results GC CT  05/23/2020  4:12 PM Negative  Negative   07/17/2019  9:17 AM Negative  Negative   06/25/2018 12:00 AM Negative  Negative   09/19/2017 12:00 AM Negative  Negative   12/13/2016 12:00 AM Negative  Negative   06/28/2016 12:00 AM Negative  Negative   01/17/2016 12:00 AM Negative  Negative   10/15/2014 12:00 AM Negative  Negative     Assessment: Lo is here  today to follow up after being prescribed PEP back in October. She called me on 10/16 after unprotected vaginal sex with a female whose HIV status is unknown. I prescribed her Biktarvy x 30 days. She presents today for testing and follow up. She had no issues getting the medication from her pharmacy and did not miss a dose. She has one pill left. No side effects with Biktarvy or signs and symptoms of acute HIV infection (fatigue, muscle aches, rash, sore throat, lymphadenopathy, headache, night sweats,  nausea/vomiting/diarrhea, and fever). She has felt fine and had no issues. No rectal or oral sex. Will check for HIV, STIs, and Hepatitis. Will call her when the results come back.   She signed a release form to have her information/labs sent to her PCP office in case she needs treatment for something. All questions answered.   Plan: - Finish Biktarvy  - HIV antibody, RPR, Hepatitis A/B/C serologies - Call with results  Donette Mainwaring L. Ashelynn Marks, PharmD, BCIDP, AAHIVP, CPP Clinical Pharmacist Practitioner Infectious Diseases Clinical Pharmacist [facility] 12/11/2021, 11:42 AM

## 2021-12-12 ENCOUNTER — Telehealth: Payer: Self-pay | Admitting: Pharmacist

## 2021-12-12 LAB — HEPATITIS B SURFACE ANTIGEN: Hepatitis B Surface Ag: NONREACTIVE

## 2021-12-12 LAB — HEPATITIS B SURFACE ANTIBODY,QUALITATIVE: Hep B S Ab: REACTIVE — AB

## 2021-12-12 LAB — HEPATITIS C ANTIBODY: Hepatitis C Ab: NONREACTIVE

## 2021-12-12 LAB — URINE CYTOLOGY ANCILLARY ONLY
Chlamydia: NEGATIVE
Comment: NEGATIVE
Comment: NORMAL
Neisseria Gonorrhea: NEGATIVE

## 2021-12-12 LAB — HIV ANTIBODY (ROUTINE TESTING W REFLEX): HIV 1&2 Ab, 4th Generation: NONREACTIVE

## 2021-12-12 LAB — RPR: RPR Ser Ql: NONREACTIVE

## 2021-12-12 NOTE — Telephone Encounter (Signed)
Called patient to let her know that all her PEP labs were negative (HIV, Hepatitis A/B/C, syphilis, gonorrhea, and chlamydia). Advised to follow up with her PCP for repeat testing in 3-6 months. She verbalized understanding.  Ayiden Milliman L. Mykenna Viele, PharmD, BCIDP, AAHIVP, CPP Clinical Pharmacist Practitioner Infectious Diseases Clinical Pharmacist [facility] 12/12/2021, 4:02 PM

## 2022-02-02 ENCOUNTER — Other Ambulatory Visit (HOSPITAL_COMMUNITY): Payer: Self-pay
